# Patient Record
Sex: Female | Born: 1960 | Race: White | Hispanic: No | Smoking: Former smoker
Health system: Southern US, Community
[De-identification: ages and names within clinical notes are randomized; demographics above are authoritative.]

## PROBLEM LIST (undated history)

## (undated) DIAGNOSIS — F329 Major depressive disorder, single episode, unspecified: Secondary | ICD-10-CM

## (undated) DIAGNOSIS — F1010 Alcohol abuse, uncomplicated: Secondary | ICD-10-CM

## (undated) DIAGNOSIS — F419 Anxiety disorder, unspecified: Secondary | ICD-10-CM

## (undated) DIAGNOSIS — E039 Hypothyroidism, unspecified: Secondary | ICD-10-CM

## (undated) DIAGNOSIS — T7840XA Allergy, unspecified, initial encounter: Secondary | ICD-10-CM

## (undated) DIAGNOSIS — F32A Depression, unspecified: Secondary | ICD-10-CM

## (undated) DIAGNOSIS — B009 Herpesviral infection, unspecified: Secondary | ICD-10-CM

## (undated) DIAGNOSIS — E785 Hyperlipidemia, unspecified: Secondary | ICD-10-CM

## (undated) DIAGNOSIS — G4700 Insomnia, unspecified: Secondary | ICD-10-CM

## (undated) DIAGNOSIS — S52502A Unspecified fracture of the lower end of left radius, initial encounter for closed fracture: Secondary | ICD-10-CM

## (undated) HISTORY — PX: COLPOSCOPY: SHX161

## (undated) HISTORY — DX: Hyperlipidemia, unspecified: E78.5

## (undated) HISTORY — DX: Insomnia, unspecified: G47.00

## (undated) HISTORY — DX: Depression, unspecified: F32.A

## (undated) HISTORY — DX: Allergy, unspecified, initial encounter: T78.40XA

## (undated) HISTORY — DX: Major depressive disorder, single episode, unspecified: F32.9

## (undated) HISTORY — DX: Hypothyroidism, unspecified: E03.9

## (undated) HISTORY — DX: Herpesviral infection, unspecified: B00.9

## (undated) HISTORY — PX: COSMETIC SURGERY: SHX468

## (undated) HISTORY — DX: Alcohol abuse, uncomplicated: F10.10

## (undated) HISTORY — DX: Anxiety disorder, unspecified: F41.9

---

## 2005-09-24 ENCOUNTER — Other Ambulatory Visit: Admission: RE | Admit: 2005-09-24 | Discharge: 2005-09-24 | Payer: Self-pay | Admitting: Internal Medicine

## 2006-01-07 ENCOUNTER — Other Ambulatory Visit: Admission: RE | Admit: 2006-01-07 | Discharge: 2006-01-07 | Payer: Self-pay | Admitting: Internal Medicine

## 2007-05-18 ENCOUNTER — Ambulatory Visit (HOSPITAL_COMMUNITY): Admission: RE | Admit: 2007-05-18 | Discharge: 2007-05-18 | Payer: Self-pay | Admitting: Neurosurgery

## 2007-06-07 ENCOUNTER — Other Ambulatory Visit: Admission: RE | Admit: 2007-06-07 | Discharge: 2007-06-07 | Payer: Self-pay | Admitting: Internal Medicine

## 2007-06-08 ENCOUNTER — Ambulatory Visit (HOSPITAL_COMMUNITY): Admission: RE | Admit: 2007-06-08 | Discharge: 2007-06-08 | Payer: Self-pay | Admitting: Internal Medicine

## 2008-02-21 ENCOUNTER — Ambulatory Visit (HOSPITAL_COMMUNITY): Admission: RE | Admit: 2008-02-21 | Discharge: 2008-02-21 | Payer: Self-pay | Admitting: Internal Medicine

## 2009-09-17 ENCOUNTER — Encounter: Admission: RE | Admit: 2009-09-17 | Discharge: 2009-09-17 | Payer: Self-pay | Admitting: Internal Medicine

## 2010-01-20 ENCOUNTER — Encounter: Admission: RE | Admit: 2010-01-20 | Discharge: 2010-01-20 | Payer: Self-pay | Admitting: Neurological Surgery

## 2010-06-18 ENCOUNTER — Encounter
Admission: RE | Admit: 2010-06-18 | Discharge: 2010-06-18 | Payer: Self-pay | Source: Home / Self Care | Attending: Neurological Surgery | Admitting: Neurological Surgery

## 2010-07-05 ENCOUNTER — Emergency Department (HOSPITAL_COMMUNITY)
Admission: EM | Admit: 2010-07-05 | Discharge: 2010-07-05 | Payer: Self-pay | Source: Home / Self Care | Admitting: Emergency Medicine

## 2010-10-21 NOTE — Op Note (Signed)
Stacy [name], Stacy [name]            ACCOUNT NO.:  [id number]   MEDICAL RECORD NO.:  [id number]          PATIENT TYPE:  AMB   LOCATION:  SDS                          FACILITY:  [facility]   PHYSICIAN:  Donalee Citrin, M.D.        DATE OF BIRTH:  1961-02-21   DATE OF PROCEDURE:  05/18/2007  DATE OF DISCHARGE:                               OPERATIVE REPORT   PREOPERATIVE DIAGNOSIS:  Right carpal tunnel syndrome.   PROCEDURE:  Right carpal tunnel release.   SURGEON:  Donalee Citrin, M.D.   ANESTHESIA:  Bier block regional.   HISTORY OF PRESENT ILLNESS:  The patient is very pleasant 50 year old  female who has had progressive worsening pain the right wrist and hand.  EMG confirmed severe right carpal tunnel syndrome.  The patient failed  all forms conservative treatment and the results of the EMG and  historical findings patient is recommended right carpal tunnel release.  Risks benefits of the operation explained to patient, understand, agrees  proceed forward.   Patient brought to OR, was induced under Bier block anesthesia with  regional block.  After adequate anesthesia been confirmed the right hand  and arm was prepped and draped in the usual sterile fashion.  The  incision was made from the proximal crease of the wrist down the palmar  crease approximately 4 cm long.  The subcutaneous tissue was dissected  free to expose the transverse carpal ligament, flexor retinaculum.  This  was then divided very carefully in layers with 15 blade scalpel.  Then  once the epineurium of the median nerve was immediately visualized and a  small mosquito hemostat was used develop the plane from the undersurface  of the transverse carpal ligament from the median nerve.  This was then  divided both proximally, distally.  Hemostats were easily passed both  proximally distally to confirm complete decompression of median nerve.  Wound was copiously irrigated and hemostasis was maintained and then the  wound  incision was closed with an interrupted vertical mattress suture.  Total tourniquet time was approximately 23 minutes and the patient was  sent to recovery in stable condition.           ______________________________  Donalee Citrin, M.D.     GC/MEDQ  D:  05/18/2007  T:  05/19/2007  Job:  [id number]

## 2011-02-16 ENCOUNTER — Other Ambulatory Visit (HOSPITAL_COMMUNITY)
Admission: RE | Admit: 2011-02-16 | Discharge: 2011-02-16 | Disposition: A | Discharge status: Home / Self Care | Payer: BC Managed Care – PPO | Source: Ambulatory Visit | Attending: Internal Medicine | Admitting: Internal Medicine

## 2011-02-16 DIAGNOSIS — Z01419 Encounter for gynecological examination (general) (routine) without abnormal findings: Secondary | ICD-10-CM | POA: Insufficient documentation

## 2011-03-16 LAB — CBC
MCV: 98.6
Platelets: 232
RBC: 3.59 — ABNORMAL LOW
RDW: 13.6
WBC: 5.7

## 2011-03-16 LAB — BASIC METABOLIC PANEL
BUN: 4 — ABNORMAL LOW
GFR calc Af Amer: >60
Glucose, Bld: 80
Sodium: 129 — ABNORMAL LOW

## 2012-07-14 ENCOUNTER — Emergency Department (HOSPITAL_COMMUNITY): Payer: BC Managed Care – PPO

## 2012-07-14 ENCOUNTER — Emergency Department (HOSPITAL_COMMUNITY)
Admission: EM | Admit: 2012-07-14 | Discharge: 2012-07-14 | Disposition: A | Discharge status: Home / Self Care | Payer: BC Managed Care – PPO | Attending: Emergency Medicine | Admitting: Emergency Medicine

## 2012-07-14 DIAGNOSIS — S298XXA Other specified injuries of thorax, initial encounter: Secondary | ICD-10-CM | POA: Insufficient documentation

## 2012-07-14 DIAGNOSIS — Y92410 Unspecified street and highway as the place of occurrence of the external cause: Secondary | ICD-10-CM | POA: Insufficient documentation

## 2012-07-14 DIAGNOSIS — S0993XA Unspecified injury of face, initial encounter: Secondary | ICD-10-CM | POA: Insufficient documentation

## 2012-07-14 DIAGNOSIS — Y9389 Activity, other specified: Secondary | ICD-10-CM | POA: Insufficient documentation

## 2012-07-14 DIAGNOSIS — S46819A Strain of other muscles, fascia and tendons at shoulder and upper arm level, unspecified arm, initial encounter: Secondary | ICD-10-CM

## 2012-07-14 DIAGNOSIS — Z79899 Other long term (current) drug therapy: Secondary | ICD-10-CM | POA: Insufficient documentation

## 2012-07-14 DIAGNOSIS — S43499A Other sprain of unspecified shoulder joint, initial encounter: Secondary | ICD-10-CM | POA: Insufficient documentation

## 2012-07-14 MED ORDER — METHOCARBAMOL 500 MG PO TABS: 1000.0000 mg | ORAL_TABLET | Freq: Four times a day (QID) | ORAL | Status: DC

## 2012-07-14 MED ORDER — TRAMADOL HCL 50 MG PO TABS: 100.0000 mg | ORAL_TABLET | Freq: Four times a day (QID) | ORAL | Status: DC | PRN

## 2012-07-14 NOTE — ED Notes (Signed)
Pt requesting water.  Per RN, water not allowed until test results.  Notified pt.
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
## 2013-03-02 ENCOUNTER — Institutional Professional Consult (permissible substitution): Payer: BC Managed Care – PPO | Admitting: Internal Medicine

## 2013-04-03 ENCOUNTER — Institutional Professional Consult (permissible substitution): Payer: BC Managed Care – PPO | Admitting: Internal Medicine

## 2013-04-24 ENCOUNTER — Encounter: Payer: Self-pay | Admitting: Internal Medicine

## 2013-04-24 DIAGNOSIS — F32A Depression, unspecified: Secondary | ICD-10-CM | POA: Insufficient documentation

## 2013-04-24 DIAGNOSIS — F419 Anxiety disorder, unspecified: Secondary | ICD-10-CM | POA: Insufficient documentation

## 2013-04-24 DIAGNOSIS — I10 Essential (primary) hypertension: Secondary | ICD-10-CM

## 2013-04-24 DIAGNOSIS — G4700 Insomnia, unspecified: Secondary | ICD-10-CM | POA: Insufficient documentation

## 2013-04-24 DIAGNOSIS — F329 Major depressive disorder, single episode, unspecified: Secondary | ICD-10-CM | POA: Insufficient documentation

## 2013-04-24 DIAGNOSIS — E785 Hyperlipidemia, unspecified: Secondary | ICD-10-CM

## 2013-04-24 DIAGNOSIS — E039 Hypothyroidism, unspecified: Secondary | ICD-10-CM

## 2013-04-25 ENCOUNTER — Ambulatory Visit: Payer: BC Managed Care – PPO | Admitting: Emergency Medicine

## 2013-04-25 ENCOUNTER — Encounter: Payer: Self-pay | Admitting: Emergency Medicine

## 2013-04-25 VITALS — BP 132/80 | HR 62 | Temp 97.8°F | Resp 18 | Ht 63.0 in | Wt 112.0 lb

## 2013-04-25 DIAGNOSIS — F329 Major depressive disorder, single episode, unspecified: Secondary | ICD-10-CM

## 2013-04-25 DIAGNOSIS — R5381 Other malaise: Secondary | ICD-10-CM

## 2013-04-25 DIAGNOSIS — E782 Mixed hyperlipidemia: Secondary | ICD-10-CM

## 2013-04-25 DIAGNOSIS — E559 Vitamin D deficiency, unspecified: Secondary | ICD-10-CM

## 2013-04-25 DIAGNOSIS — I10 Essential (primary) hypertension: Secondary | ICD-10-CM

## 2013-04-25 DIAGNOSIS — E039 Hypothyroidism, unspecified: Secondary | ICD-10-CM

## 2013-04-25 DIAGNOSIS — J329 Chronic sinusitis, unspecified: Secondary | ICD-10-CM

## 2013-04-25 LAB — CBC WITH DIFFERENTIAL/PLATELET
Basophils Absolute: 0 10*3/uL (ref 0.0–0.1)
Basophils Relative: 0 % (ref 0–1)
Lymphocytes Relative: 22 % (ref 12–46)
Lymphs Abs: 1.8 10*3/uL (ref 0.7–4.0)
MCH: 36.1 pg — ABNORMAL HIGH (ref 26.0–34.0)
MCHC: 35.8 g/dL (ref 30.0–36.0)
MCV: 100.8 fL — ABNORMAL HIGH (ref 78.0–100.0)
Neutrophils Relative %: 69 % (ref 43–77)
Platelets: 242 10*3/uL (ref 150–400)
RDW: 13.8 % (ref 11.5–15.5)
WBC: 8.3 10*3/uL (ref 4.0–10.5)

## 2013-04-25 LAB — HEPATIC FUNCTION PANEL
ALT: 33 U/L (ref 0–35)
Albumin: 5 g/dL (ref 3.5–5.2)
Alkaline Phosphatase: 57 U/L (ref 39–117)
Bilirubin, Direct: 0.1 mg/dL (ref 0.0–0.3)
Total Bilirubin: 0.5 mg/dL (ref 0.3–1.2)
Total Protein: 7.6 g/dL (ref 6.0–8.3)

## 2013-04-25 LAB — LIPID PANEL
Cholesterol: 210 mg/dL — ABNORMAL HIGH (ref 0–200)
LDL Cholesterol: 92 mg/dL (ref 0–99)
Triglycerides: 109 mg/dL (ref <150)

## 2013-04-25 LAB — BASIC METABOLIC PANEL WITH GFR
Creat: 0.59 mg/dL (ref 0.50–1.10)
GFR, Est African American: >89 mL/min
Potassium: 4.5 mEq/L (ref 3.5–5.3)

## 2013-04-25 MED ORDER — AZITHROMYCIN 250 MG PO TABS: ORAL_TABLET | ORAL | Status: AC

## 2013-04-25 MED ORDER — ALPRAZOLAM 0.5 MG PO TABS: 0.5000 mg | ORAL_TABLET | Freq: Three times a day (TID) | ORAL | Status: DC | PRN

## 2013-04-25 NOTE — Progress Notes (Signed)
Subjective:    Patient ID: Stacy [name], female    DOB: 02-12-1961, 52 y.o.   MRN: [id number]  HPI Comments: 52 yo female presents for recheck of cholesterol, TSH, Depression. She is still under stress with family and feels it is contributing to elevated BP. She has not been exercising or eating healthy. She has decreased wine intake. She is still not sleeping well with the stress and travel schedule and still feels a little depressed even with increasing Brintellix dose to 20 mg. She changed Thyroid dose x 1 month then went back to previous dose, since she did not notice any change with energy or mood. She has noticed increased sinus congestion and pressure over last 2 weeks now with production increase    Current Outpatient Prescriptions on File Prior to Visit  Medication Sig Dispense Refill  . acyclovir (ZOVIRAX) 200 MG capsule Take 200 mg by mouth 2 (two) times daily.      . eszopiclone (LUNESTA) 2 MG TABS tablet Take 2 mg by mouth at bedtime as needed for sleep. Take immediately before bedtime      . fluticasone (FLONASE) 50 MCG/ACT nasal spray Place 2 sprays into the nose daily.      Marland Kitchen levocetirizine (XYZAL) 5 MG tablet Take 5 mg by mouth every evening.      Marland Kitchen levothyroxine (SYNTHROID, LEVOTHROID) 88 MCG tablet Take 88 mcg by mouth daily.      . nadolol (CORGARD) 20 MG tablet Take 20 mg by mouth 2 (two) times daily.      . Vortioxetine HBr (BRINTELLIX) 20 MG TABS Take 20 mg by mouth daily. 1/2 occasional      . methocarbamol (ROBAXIN) 500 MG tablet Take 2 tablets (1,000 mg total) by mouth 4 (four) times daily.  80 tablet  0  . traMADol (ULTRAM) 50 MG tablet Take 2 tablets (100 mg total) by mouth every 6 (six) hours as needed for pain.  16 tablet  0   No current facility-administered medications on file prior to visit.   ALLERGIES Ambien  Past Medical History  Diagnosis Date  . Hypertension   . Anxiety   . Depression   . Unspecified hypothyroidism   . Insomnia   . HSV (herpes  simplex virus) infection   . Allergy   . Hyperlipidemia      Review of Systems  Constitutional: Positive for fatigue.  HENT: Positive for congestion and sinus pressure.   Psychiatric/Behavioral: Positive for sleep disturbance. The patient is nervous/anxious.   All other systems reviewed and are negative.    BP 132/80  Pulse 62  Temp(Src) 97.8 F (36.6 C) (Temporal)  Resp 18  Ht 5\' 3"  (1.6 m)  Wt 112 lb (50.803 kg)  BMI 19.84 kg/m2     Objective:   Physical Exam  Nursing note and vitals reviewed. Constitutional: She is oriented to person, place, and time. She appears well-developed and well-nourished.  HENT:  Head: Normocephalic and atraumatic.  Eyes: Pupils are equal, round, and reactive to light.  Neck: Normal range of motion. No thyromegaly present.  Cardiovascular: Normal rate, regular rhythm, normal heart sounds and intact distal pulses.   Pulmonary/Chest: Effort normal and breath sounds normal.  Abdominal: Soft.  Musculoskeletal: Normal range of motion.  Lymphadenopathy:    She has no cervical adenopathy.  Neurological: She is alert and oriented to person, place, and time.  Skin: Skin is warm and dry.  Psychiatric: She has a normal mood and affect. Judgment normal.  Assessment & Plan:  1. Cholesterol, Hypothyroid- recheck labs 2. Depression, sleep disturbance, fatigue- needs routine set and increase counseling/ exercise, decrease ETOH 3. Sinusitis- Zpak AD, mucinex AD.

## 2013-04-25 NOTE — Patient Instructions (Signed)
Fat and Cholesterol Control Diet Fat and cholesterol levels in your blood and organs are influenced by your diet. High levels of fat and cholesterol may lead to diseases of the heart, small and large blood vessels, gallbladder, liver, and pancreas. CONTROLLING FAT AND CHOLESTEROL WITH DIET Although exercise and lifestyle factors are important, your diet is key. That is because certain foods are known to raise cholesterol and others to lower it. The goal is to balance foods for their effect on cholesterol and more importantly, to replace saturated and trans fat with other types of fat, such as monounsaturated fat, polyunsaturated fat, and omega-3 fatty acids. On average, a person should consume no more than 15 to 17 g of saturated fat daily. Saturated and trans fats are considered "bad" fats, and they will raise LDL cholesterol. Saturated fats are primarily found in animal products such as meats, butter, and cream. However, that does not mean you need to give up all your favorite foods. Today, there are good tasting, low-fat, low-cholesterol substitutes for most of the things you like to eat. Choose low-fat or nonfat alternatives. Choose round or loin cuts of red meat. These types of cuts are lowest in fat and cholesterol. Chicken (without the skin), fish, veal, and ground turkey breast are great choices. Eliminate fatty meats, such as hot dogs and salami. Even shellfish have little or no saturated fat. Have a 3 oz (85 g) portion when you eat lean meat, poultry, or fish. Trans fats are also called "partially hydrogenated oils." They are oils that have been scientifically manipulated so that they are solid at room temperature resulting in a longer shelf life and improved taste and texture of foods in which they are added. Trans fats are found in stick margarine, some tub margarines, cookies, crackers, and baked goods.  When baking and cooking, oils are a great substitute for butter. The monounsaturated oils are  especially beneficial since it is believed they lower LDL and raise HDL. The oils you should avoid entirely are saturated tropical oils, such as coconut and palm.  Remember to eat a lot from food groups that are naturally free of saturated and trans fat, including fish, fruit, vegetables, beans, grains (barley, rice, couscous, bulgur wheat), and pasta (without cream sauces).  IDENTIFYING FOODS THAT LOWER FAT AND CHOLESTEROL  Soluble fiber may lower your cholesterol. This type of fiber is found in fruits such as apples, vegetables such as broccoli, potatoes, and carrots, legumes such as beans, peas, and lentils, and grains such as barley. Foods fortified with plant sterols (phytosterol) may also lower cholesterol. You should eat at least 2 g per day of these foods for a cholesterol lowering effect.  Read package labels to identify low-saturated fats, trans fat free, and low-fat foods at the supermarket. Select cheeses that have only 2 to 3 g saturated fat per ounce. Use a heart-healthy tub margarine that is free of trans fats or partially hydrogenated oil. When buying baked goods (cookies, crackers), avoid partially hydrogenated oils. Breads and muffins should be made from whole grains (whole-wheat or whole oat flour, instead of "flour" or "enriched flour"). Buy non-creamy canned soups with reduced salt and no added fats.  FOOD PREPARATION TECHNIQUES  Never deep-fry. If you must fry, either stir-fry, which uses very little fat, or use non-stick cooking sprays. When possible, broil, bake, or roast meats, and steam vegetables. Instead of putting butter or margarine on vegetables, use lemon and herbs, applesauce, and cinnamon (for squash and sweet potatoes). Use nonfat   yogurt, salsa, and low-fat dressings for salads.  LOW-SATURATED FAT / LOW-FAT FOOD SUBSTITUTES Meats / Saturated Fat (g)  Avoid: Steak, marbled (3 oz/85 g) / 11 g  Choose: Steak, lean (3 oz/85 g) / 4 g  Avoid: Hamburger (3 oz/85 g) / 7  g  Choose: Hamburger, lean (3 oz/85 g) / 5 g  Avoid: Ham (3 oz/85 g) / 6 g  Choose: Ham, lean cut (3 oz/85 g) / 2.4 g  Avoid: Chicken, with skin, dark meat (3 oz/85 g) / 4 g  Choose: Chicken, skin removed, dark meat (3 oz/85 g) / 2 g  Avoid: Chicken, with skin, light meat (3 oz/85 g) / 2.5 g  Choose: Chicken, skin removed, light meat (3 oz/85 g) / 1 g Dairy / Saturated Fat (g)  Avoid: Whole milk (1 cup) / 5 g  Choose: Low-fat milk, 2% (1 cup) / 3 g  Choose: Low-fat milk, 1% (1 cup) / 1.5 g  Choose: Skim milk (1 cup) / 0.3 g  Avoid: Hard cheese (1 oz/28 g) / 6 g  Choose: Skim milk cheese (1 oz/28 g) / 2 to 3 g  Avoid: Cottage cheese, 4% fat (1 cup) / 6.5 g  Choose: Low-fat cottage cheese, 1% fat (1 cup) / 1.5 g  Avoid: Ice cream (1 cup) / 9 g  Choose: Sherbet (1 cup) / 2.5 g  Choose: Nonfat frozen yogurt (1 cup) / 0.3 g  Choose: Frozen fruit bar / trace  Avoid: Whipped cream (1 tbs) / 3.5 g  Choose: Nondairy whipped topping (1 tbs) / 1 g Condiments / Saturated Fat (g)  Avoid: Mayonnaise (1 tbs) / 2 g  Choose: Low-fat mayonnaise (1 tbs) / 1 g  Avoid: Butter (1 tbs) / 7 g  Choose: Extra light margarine (1 tbs) / 1 g  Avoid: Coconut oil (1 tbs) / 11.8 g  Choose: Olive oil (1 tbs) / 1.8 g  Choose: Corn oil (1 tbs) / 1.7 g  Choose: Safflower oil (1 tbs) / 1.2 g  Choose: Sunflower oil (1 tbs) / 1.4 g  Choose: Soybean oil (1 tbs) / 2.4 g  Choose: Canola oil (1 tbs) / 1 g Document Released: 05/25/2005 Document Revised: 09/19/2012 Document Reviewed: 11/13/2010 [employer] Patient Information 2014 [employer]. Sinusitis Sinusitis is redness, soreness, and puffiness (inflammation) of the air pockets in the bones of your face (sinuses). The redness, soreness, and puffiness can cause air and mucus to get trapped in your sinuses. This can allow germs to grow and cause an infection.  HOME CARE   Drink enough fluids to keep your pee (urine) clear or pale  yellow.  Use a humidifier in your home.  Run a hot shower to create steam in the bathroom. Sit in the bathroom with the door closed. Breathe in the steam 3 4 times a day.  Put a warm, moist washcloth on your face 3 4 times a day, or as told by your doctor.  Use salt water sprays (saline sprays) to wet the thick fluid in your nose. This can help the sinuses drain.  Only take medicine as told by your doctor. GET HELP RIGHT AWAY IF:   Your pain gets worse.  You have very bad headaches.  You are sick to your stomach (nauseous).  You throw up (vomit).  You are very sleepy (drowsy) all the time.  Your face is puffy (swollen).  Your vision changes.  You have a stiff neck.  You have trouble breathing. MAKE   SURE YOU:   Understand these instructions.  Will watch your condition.  Will get help right away if you are not doing well or get worse. Document Released: 11/11/2007 Document Revised: 02/17/2012 Document Reviewed: 12/29/2011 [employer] Patient Information 2014 [employer].  

## 2013-04-26 LAB — FOLATE RBC: RBC Folate: 872 ng/mL (ref >280)

## 2013-04-27 ENCOUNTER — Telehealth: Payer: Self-pay | Admitting: *Deleted

## 2013-04-27 NOTE — Telephone Encounter (Signed)
Message copied by Nicholaus Corolla A on Thu Apr 27, 2013 11:05 AM ------      Message from: [place], [place] R      Created: Thu Apr 27, 2013  8:44 AM       Add 250 mg Magnesium. TSH low end normal try to take 1/2 on MWF, 1 rest of week may help with mood and energy, keep dosing this way do not revert back to old dose. Cholesterol and liver mild elevation need to decrease all forms of ETOH and eat healthier. Rest ok continue same.  Needs OV 3 months ------

## 2013-04-27 NOTE — Telephone Encounter (Signed)
Spoke with patient about lab results and instructions. 

## 2013-05-08 ENCOUNTER — Other Ambulatory Visit: Payer: Self-pay | Admitting: Emergency Medicine

## 2013-05-08 MED ORDER — ESZOPICLONE 2 MG PO TABS: 2.0000 mg | ORAL_TABLET | Freq: Every evening | ORAL | Status: DC | PRN

## 2013-05-26 ENCOUNTER — Other Ambulatory Visit: Payer: Self-pay | Admitting: Emergency Medicine

## 2013-05-26 DIAGNOSIS — F329 Major depressive disorder, single episode, unspecified: Secondary | ICD-10-CM

## 2013-05-26 DIAGNOSIS — F32A Depression, unspecified: Secondary | ICD-10-CM

## 2013-05-26 MED ORDER — ALPRAZOLAM 0.5 MG PO TABS: 0.5000 mg | ORAL_TABLET | Freq: Three times a day (TID) | ORAL | Status: DC | PRN

## 2013-06-05 ENCOUNTER — Other Ambulatory Visit: Payer: Self-pay | Admitting: Emergency Medicine

## 2013-06-05 MED ORDER — ESZOPICLONE 2 MG PO TABS: 2.0000 mg | ORAL_TABLET | Freq: Every evening | ORAL | Status: DC | PRN

## 2013-07-05 ENCOUNTER — Other Ambulatory Visit: Payer: Self-pay | Admitting: Emergency Medicine

## 2013-07-05 MED ORDER — ESZOPICLONE 2 MG PO TABS: 2.0000 mg | ORAL_TABLET | Freq: Every evening | ORAL | Status: DC | PRN

## 2013-07-06 ENCOUNTER — Other Ambulatory Visit: Payer: Self-pay | Admitting: Physician Assistant

## 2013-07-12 ENCOUNTER — Other Ambulatory Visit: Payer: Self-pay | Admitting: Internal Medicine

## 2013-07-20 ENCOUNTER — Ambulatory Visit: Payer: Self-pay | Admitting: Emergency Medicine

## 2013-07-26 ENCOUNTER — Ambulatory Visit: Payer: Self-pay | Admitting: Emergency Medicine

## 2013-08-01 ENCOUNTER — Ambulatory Visit: Payer: Self-pay | Admitting: Emergency Medicine

## 2013-08-08 ENCOUNTER — Ambulatory Visit: Payer: Self-pay | Admitting: Emergency Medicine

## 2013-08-09 ENCOUNTER — Ambulatory Visit: Payer: Self-pay | Admitting: Emergency Medicine

## 2013-08-13 ENCOUNTER — Other Ambulatory Visit: Payer: Self-pay | Admitting: Internal Medicine

## 2013-08-13 MED ORDER — ALPRAZOLAM 0.5 MG PO TABS: 0.5000 mg | ORAL_TABLET | Freq: Three times a day (TID) | ORAL | Status: DC | PRN

## 2013-08-14 ENCOUNTER — Ambulatory Visit (INDEPENDENT_AMBULATORY_CARE_PROVIDER_SITE_OTHER): Payer: BC Managed Care – PPO | Admitting: Emergency Medicine

## 2013-08-14 ENCOUNTER — Encounter: Payer: Self-pay | Admitting: Emergency Medicine

## 2013-08-14 VITALS — BP 138/90 | HR 68 | Temp 98.6°F | Resp 18 | Ht 63.0 in | Wt 112.0 lb

## 2013-08-14 DIAGNOSIS — R5381 Other malaise: Secondary | ICD-10-CM

## 2013-08-14 DIAGNOSIS — F329 Major depressive disorder, single episode, unspecified: Secondary | ICD-10-CM

## 2013-08-14 DIAGNOSIS — E039 Hypothyroidism, unspecified: Secondary | ICD-10-CM

## 2013-08-14 DIAGNOSIS — F32A Depression, unspecified: Secondary | ICD-10-CM

## 2013-08-14 DIAGNOSIS — E538 Deficiency of other specified B group vitamins: Secondary | ICD-10-CM

## 2013-08-14 DIAGNOSIS — Z79899 Other long term (current) drug therapy: Secondary | ICD-10-CM

## 2013-08-14 DIAGNOSIS — F3289 Other specified depressive episodes: Secondary | ICD-10-CM

## 2013-08-14 DIAGNOSIS — R5383 Other fatigue: Principal | ICD-10-CM

## 2013-08-14 DIAGNOSIS — F411 Generalized anxiety disorder: Secondary | ICD-10-CM

## 2013-08-14 DIAGNOSIS — D649 Anemia, unspecified: Secondary | ICD-10-CM

## 2013-08-14 DIAGNOSIS — E559 Vitamin D deficiency, unspecified: Secondary | ICD-10-CM

## 2013-08-14 DIAGNOSIS — F419 Anxiety disorder, unspecified: Secondary | ICD-10-CM

## 2013-08-14 DIAGNOSIS — E782 Mixed hyperlipidemia: Secondary | ICD-10-CM

## 2013-08-14 DIAGNOSIS — I10 Essential (primary) hypertension: Secondary | ICD-10-CM

## 2013-08-14 LAB — BASIC METABOLIC PANEL WITH GFR
BUN: 12 mg/dL (ref 6–23)
CALCIUM: 10.4 mg/dL (ref 8.4–10.5)
CO2: 27 meq/L (ref 19–32)
CREATININE: 0.65 mg/dL (ref 0.50–1.10)
Chloride: 98 mEq/L (ref 96–112)
GFR, Est African American: >89 mL/min
GFR, Est Non African American: >89 mL/min
GLUCOSE: 108 mg/dL — AB (ref 70–99)
Potassium: 4.3 mEq/L (ref 3.5–5.3)
SODIUM: 137 meq/L (ref 135–145)

## 2013-08-14 LAB — CBC WITH DIFFERENTIAL/PLATELET
Basophils Absolute: 0 10*3/uL (ref 0.0–0.1)
Basophils Relative: 1 % (ref 0–1)
EOS ABS: 0 10*3/uL (ref 0.0–0.7)
EOS PCT: 1 % (ref 0–5)
HCT: 37.2 % (ref 36.0–46.0)
Hemoglobin: 12.8 g/dL (ref 12.0–15.0)
LYMPHS ABS: 1.9 10*3/uL (ref 0.7–4.0)
Lymphocytes Relative: 40 % (ref 12–46)
MCH: 34.9 pg — AB (ref 26.0–34.0)
MCHC: 34.4 g/dL (ref 30.0–36.0)
MCV: 101.4 fL — AB (ref 78.0–100.0)
Monocytes Absolute: 0.5 10*3/uL (ref 0.1–1.0)
Monocytes Relative: 11 % (ref 3–12)
Neutro Abs: 2.3 10*3/uL (ref 1.7–7.7)
Neutrophils Relative %: 47 % (ref 43–77)
PLATELETS: 241 10*3/uL (ref 150–400)
RBC: 3.67 MIL/uL — AB (ref 3.87–5.11)
RDW: 13.7 % (ref 11.5–15.5)
WBC: 4.8 10*3/uL (ref 4.0–10.5)

## 2013-08-14 LAB — HEPATIC FUNCTION PANEL
ALBUMIN: 5.1 g/dL (ref 3.5–5.2)
ALT: 41 U/L — ABNORMAL HIGH (ref 0–35)
AST: 77 U/L — ABNORMAL HIGH (ref 0–37)
Alkaline Phosphatase: 70 U/L (ref 39–117)
Bilirubin, Direct: 0.2 mg/dL (ref 0.0–0.3)
Indirect Bilirubin: 0.8 mg/dL (ref 0.2–1.2)
Total Bilirubin: 1 mg/dL (ref 0.2–1.2)
Total Protein: 7.7 g/dL (ref 6.0–8.3)

## 2013-08-14 LAB — LIPID PANEL
Cholesterol: 241 mg/dL — ABNORMAL HIGH (ref 0–200)
HDL: 107 mg/dL (ref >39)
LDL Cholesterol: 122 mg/dL — ABNORMAL HIGH (ref 0–99)
TRIGLYCERIDES: 61 mg/dL (ref <150)
Total CHOL/HDL Ratio: 2.3 Ratio
VLDL: 12 mg/dL (ref 0–40)

## 2013-08-14 LAB — IRON AND TIBC
%SAT: 73 % — AB (ref 20–55)
IRON: 270 ug/dL — AB (ref 42–145)
TIBC: 369 ug/dL (ref 250–470)
UIBC: 99 ug/dL — ABNORMAL LOW (ref 125–400)

## 2013-08-14 LAB — TSH: TSH: 4.071 u[IU]/mL (ref 0.350–4.500)

## 2013-08-14 LAB — VITAMIN B12: VITAMIN B 12: 389 pg/mL (ref 211–911)

## 2013-08-14 LAB — MAGNESIUM: Magnesium: 1.6 mg/dL (ref 1.5–2.5)

## 2013-08-14 MED ORDER — SERTRALINE HCL 50 MG PO TABS: 50.0000 mg | ORAL_TABLET | Freq: Every day | ORAL | Status: DC

## 2013-08-14 NOTE — Progress Notes (Signed)
Subjective:    Patient ID: Stacy [name], female    DOB: 04/21/1961, 53 y.o.   MRN: [id number]  HPI Comments: 53 yo pleasant but emotional female. She has a long history of anxiety and depression and has been out of her Brintellix for at least 9 days. She noted it did not fully relieve anxiety/ depression even before she ran out, but symptoms are significantly worse. She does not want to leave the house nor be around people. She is taking more Lunesta than prescribed and now has added xanax because she is not sleeping. She feels scattered confused and notes concern for learning disability vs ADD vs anxiety. She notes + stress with family mild increase but no other reasons for increase in overall symptoms. She has increased ETOH intake and decreased activity due to her fatigue and mood. She has missed or had to reschedule several appointments with her increased confusion/ anxiety. SHE HAS FAILED WELLBUTRIN/ LEXAPRO/ CELEXA  She is overdue for labs for cholesterol but knows they will be bad with poor diet, ETOH, and lack of exercise. T225 L 108 H 102 B12 315 A1C 5.3 D 63  ALT           33   04/25/2013 AST           48   04/25/2013 ALKPHOS       57   04/25/2013 BILITOT      0.5   04/25/2013 WBC      8.3   04/25/2013 HGB     13.7   04/25/2013 HCT     38.3   04/25/2013 MCV    100.8   04/25/2013 PLT      242   04/25/2013 TSH    0.844   04/25/2013   Anxiety Symptoms include confusion, decreased concentration and nervous/anxious behavior. Patient reports no suicidal ideas.       Current Outpatient Prescriptions on File Prior to Visit  Medication Sig Dispense Refill  . acyclovir (ZOVIRAX) 200 MG capsule Take 200 mg by mouth 2 (two) times daily as needed.       . ALPRAZolam (XANAX) 0.5 MG tablet Take 1 tablet (0.5 mg total) by mouth 3 (three) times daily as needed for anxiety.  90 tablet  0  . eszopiclone (LUNESTA) 2 MG TABS tablet Take 1 tablet (2 mg total) by mouth at bedtime as needed for  sleep. Take immediately before bedtime  30 tablet  1  . fluticasone (FLONASE) 50 MCG/ACT nasal spray Place 2 sprays into the nose daily.      Marland Kitchen. levocetirizine (XYZAL) 5 MG tablet Take 5 mg by mouth daily as needed.       Marland Kitchen. levothyroxine (SYNTHROID, LEVOTHROID) 88 MCG tablet Take 88 mcg by mouth daily.      . nadolol (CORGARD) 20 MG tablet Take 20 mg by mouth 2 (two) times daily.      . Vortioxetine HBr (BRINTELLIX) 20 MG TABS Take 20 mg by mouth daily. 1/2 occasional       No current facility-administered medications on file prior to visit.   Allergies  Allergen Reactions  . Ambien [Zolpidem Tartrate] Other (See Comments)    hallucinations   Past Medical History  Diagnosis Date  . Hypertension   . Anxiety   . Depression   . Unspecified hypothyroidism   . Insomnia   . HSV (herpes simplex virus) infection   . Allergy   . Hyperlipidemia      Review of Systems  Constitutional: Positive for fatigue.  Psychiatric/Behavioral: Positive for confusion, sleep disturbance and decreased concentration. Negative for suicidal ideas. The patient is nervous/anxious.   All other systems reviewed and are negative.   BP 138/90  Pulse 68  Temp(Src) 98.6 F (37 C) (Temporal)  Resp 18  Ht 5\' 3"  (1.6 m)  Wt 112 lb (50.803 kg)  BMI 19.84 kg/m2     Objective:   Physical Exam  Nursing note and vitals reviewed. Constitutional: She is oriented to person, place, and time. She appears well-developed and well-nourished. No distress.  HENT:  Head: Normocephalic and atraumatic.  Right Ear: External ear normal.  Left Ear: External ear normal.  Nose: Nose normal.  Mouth/Throat: Oropharynx is clear and moist.  Eyes: Conjunctivae and EOM are normal.  Neck: Normal range of motion. Neck supple. No JVD present. No thyromegaly present.  Cardiovascular: Normal rate, regular rhythm, normal heart sounds and intact distal pulses.   Pulmonary/Chest: Effort normal and breath sounds normal.  Abdominal: Soft.  Bowel sounds are normal. She exhibits no distension and no mass. There is no tenderness. There is no rebound and no guarding.  Musculoskeletal: Normal range of motion. She exhibits no edema and no tenderness.  Lymphadenopathy:    She has no cervical adenopathy.  Neurological: She is alert and oriented to person, place, and time. No cranial nerve deficit.  Skin: Skin is warm and dry. No rash noted. No erythema. No pallor.  Psychiatric: Her behavior is normal. Judgment normal.  TEARFUL, ANXIOUS, SCATTERED. Multiple lists/calendars brought by patient with multiple tasks on each day, with corrections and directions scribbled everywhere. Hard to keep her focused during discussion.          Assessment & Plan:  1. DEPRESSION/ Mood disorder/ Anxiety/ Insomnia/ Etoh Dependence/ Confusion- LONG Discussion about compliance with medications, NO ETOH with medications, need for counseling and Psychiatric evaluation. DO NOT restart Brintellix will try Zoloft 50 mg 1 daily and consider Mood stabilizer. HARD to try to manage medications when patient is not taking them AD. She declines CT head to evaluate confusion, advised could be from improperly taking RX or lack of sleep or combo with ETOH. Advised needs serious re-evaluation of life and stress before things get worse. Advised we will send for LD testing vs ADD once we get mood stable. w/c if SX increase or ER.  2. Hypothyroid vs Fatigue- check labs, increase activity and H2O, concern for Hx of improperly taking Synthroid, recheck labs vs fatigue with anemia hx vs depression. She wants/ will get referral to Endocrine.  3. Cholesterol- recheck labs, Need to eat healthier and exercise AD.  OVER 40 minutes of exam, counseling, chart review, referral performed

## 2013-08-14 NOTE — Patient Instructions (Signed)
Depression, Adult Depression is feeling sad, low, down in the dumps, blue, gloomy, or empty. In general, there are two kinds of depression:  Normal sadness or grief. This can happen after something upsetting. It often goes away on its own within 2 weeks. After losing a loved one (bereavement), normal sadness and grief may last longer than two weeks. It usually gets better with time. Clinical depression. This kind lasts longer than normal sadness or grief. It keeps you from doing the things you normally do in life. It is often hard to function Social Anxiety Disorder Social anxiety disorder, previously called social phobia, is a mental disorder. People with social anxiety disorder frequently feel nervous, afraid, or embarrassed when around other people in social situations. They constantly worry that other people are judging or criticizing them for how they look, what they say, or how they act. They may worry that other people might reject them because of their appearance or behavior. Social anxiety disorder is more than just occasional shyness or self-consciousness. It can cause severe emotional distress. It can interfere with daily life activities. Social anxiety disorder also may lead to excessive alcohol or drug use and even suicide.  Social anxiety disorder is actually one of the most common mental disorders. It can develop at any time but usually starts in the teenage years. Women are more commonly affected than men. Social anxiety disorder is also more common in people who have family members with anxiety disorders. It also is more common in people who have physical deformities or conditions with characteristics that are obvious to others, such as stuttered speech or movement abnormalities (Parkinson disease).  SYMPTOMS  In addition to feeling anxious or fearful in social situations, people with social anxiety disorder frequently have physical symptoms. Examples include: Red face (blushing). Racing  heart. Sweating. Shaky hands or voice. Confusion. Lightheadedness. Upset stomach and diarrhea. DIAGNOSIS  Social anxiety disorder is diagnosed through an assessment by your caregiver. Your caregiver will ask you questions about your mood, thoughts, and reactions in social situations. Your caregiver may ask you about your medical history and use of alcohol or drugs, including prescription medications. Certain medical conditions and the use of certain substances, including caffeine, can cause symptoms similar to social anxiety disorder. Your caregiver may refer you to a mental health specialist for further evaluation or treatment. The criteria for diagnosis of social anxiety disorder are: Marked fear or anxiety in one or more social situations in which you may be closely watched or studied by others. Examples of such situations include: Interacting socially (having a conversation with others, going to a party, or meeting strangers). Being observed (eating or drinking in public or being called on in class). Performing in front of others (giving a speech). The social situations of concern almost always cause fear or anxiety, not just occasionally. People with social anxiety disorder fear that they will be viewed negatively in a way that will be embarrassing, will lead to rejection, or will offend others. This fear is out of proportion to the actual threat posed by the social situation. Often the triggering social situations are avoided, or they are endured with intense fear or anxiety. The fear, anxiety, or avoidance is persistent and lasts for 6 months or longer. The anxiety causes difficulty functioning in at least some parts of your daily life. TREATMENT  Several types of treatment are available for social anxiety disorder. These treatments are often used in combination and include:  Talk therapy. Group talk therapy allows  you to see that you are not alone with these problems. Individual talk  therapy helps you address your specific anxiety issues with a caring professional. The most effective forms of talk therapy for social anxiety disorder are cognitive behavioral therapy and exposure therapy. Cognitive behavioral therapy helps you to identify and change negative thoughts and beliefs that are at the root of the disorder. Exposure therapy allows you to gradually face the situations that you fear most. Relaxation and coping techniques. These include deep breathing, self-talk, meditation, visual imagery, and yoga. Relaxation techniques help to keep you calm in social situations. Social Optician, dispensingskills training.Social skills can be learned on your own or with the help of a talk therapist. They can help you feel more confident and comfortable in social situations. Medication. For anxiety limited to performance situations (performance anxiety), medication called beta blockers can help by reducing or preventing the physical symptoms of social anxiety disorder. For more persistent and generalized social anxiety, antidepressant medication may be prescribed to help control symptoms. In severe cases of social anxiety disorder, strong antianxiety medication, called benzodiazepines, may be prescribed on a limited basis and for a short time. Document Released: 04/23/2005 Document Revised: 09/19/2012 Document Reviewed: 08/23/2012 [facility] Patient Information 2014 [place], [place].  at home, work, or at school. It may affect your relationships with others. Treatment is often needed. GET HELP RIGHT AWAY IF:  You have thoughts about hurting yourself or others.  You lose touch with reality (psychotic symptoms). You may:  See or hear things that are not real.  Have untrue beliefs about your life or people around you.  Your medicine is giving you problems. MAKE SURE YOU:  Understand these instructions.  Will watch your condition.  Will get help right away if you are not doing well or get worse. Document  Released: 06/27/2010 Document Revised: 02/17/2012 Document Reviewed: 09/24/2011 [facility] Patient Information 2014 [place]Care, [place].

## 2013-08-15 LAB — VITAMIN D 25 HYDROXY (VIT D DEFICIENCY, FRACTURES): Vit D, 25-Hydroxy: 45 ng/mL (ref 30–89)

## 2013-08-23 ENCOUNTER — Telehealth: Payer: Self-pay | Admitting: *Deleted

## 2013-08-23 NOTE — Telephone Encounter (Signed)
RT CALL ( LEAVE DETAIL MESSAGE ON VM)

## 2013-08-25 ENCOUNTER — Ambulatory Visit: Payer: Self-pay | Admitting: Emergency Medicine

## 2013-08-28 ENCOUNTER — Ambulatory Visit: Payer: BC Managed Care – PPO | Admitting: Endocrinology

## 2013-08-30 ENCOUNTER — Telehealth: Payer: Self-pay | Admitting: Internal Medicine

## 2013-08-30 ENCOUNTER — Other Ambulatory Visit: Payer: Self-pay | Admitting: Emergency Medicine

## 2013-08-30 MED ORDER — ESZOPICLONE 2 MG PO TABS: 2.0000 mg | ORAL_TABLET | Freq: Every evening | ORAL | Status: DC | PRN

## 2013-08-30 NOTE — Telephone Encounter (Signed)
PT REQUESTING LUNESTA RENEWAL, FLYING OUT 27TH, AND RENEWAL NOT AVAILABLE UNTIL 27TH, CAN SHE GET IT EARLIER?  PHARM- [facility] [address] -[phone]  PS[facility], WILL HAVE SCHEDULE AND CALL TOMORROW.  THANKS, KAT

## 2013-08-31 ENCOUNTER — Other Ambulatory Visit: Payer: Self-pay | Admitting: Emergency Medicine

## 2013-09-05 ENCOUNTER — Encounter: Payer: Self-pay | Admitting: Emergency Medicine

## 2013-09-05 ENCOUNTER — Ambulatory Visit (INDEPENDENT_AMBULATORY_CARE_PROVIDER_SITE_OTHER): Payer: BC Managed Care – PPO | Admitting: Emergency Medicine

## 2013-09-05 VITALS — BP 124/70 | HR 68 | Temp 98.2°F | Resp 16 | Ht 63.0 in | Wt 112.0 lb

## 2013-09-05 DIAGNOSIS — E039 Hypothyroidism, unspecified: Secondary | ICD-10-CM

## 2013-09-05 DIAGNOSIS — R5381 Other malaise: Secondary | ICD-10-CM

## 2013-09-05 DIAGNOSIS — R413 Other amnesia: Secondary | ICD-10-CM

## 2013-09-05 DIAGNOSIS — F411 Generalized anxiety disorder: Secondary | ICD-10-CM

## 2013-09-05 DIAGNOSIS — R5383 Other fatigue: Secondary | ICD-10-CM

## 2013-09-05 LAB — BASIC METABOLIC PANEL WITH GFR
BUN: 4 mg/dL — AB (ref 6–23)
CO2: 26 mEq/L (ref 19–32)
Calcium: 10.2 mg/dL (ref 8.4–10.5)
Chloride: 93 mEq/L — ABNORMAL LOW (ref 96–112)
Creat: 0.55 mg/dL (ref 0.50–1.10)
GFR, Est African American: >89 mL/min
Glucose, Bld: 77 mg/dL (ref 70–99)
POTASSIUM: 4.4 meq/L (ref 3.5–5.3)
SODIUM: 130 meq/L — AB (ref 135–145)

## 2013-09-05 LAB — CBC WITH DIFFERENTIAL/PLATELET
BASOS PCT: 1 % (ref 0–1)
Basophils Absolute: 0 10*3/uL (ref 0.0–0.1)
Eosinophils Absolute: 0 10*3/uL (ref 0.0–0.7)
Eosinophils Relative: 1 % (ref 0–5)
HCT: 36.6 % (ref 36.0–46.0)
Hemoglobin: 13 g/dL (ref 12.0–15.0)
Lymphocytes Relative: 31 % (ref 12–46)
Lymphs Abs: 1.4 10*3/uL (ref 0.7–4.0)
MCH: 35.4 pg — ABNORMAL HIGH (ref 26.0–34.0)
MCHC: 35.5 g/dL (ref 30.0–36.0)
MCV: 99.7 fL (ref 78.0–100.0)
MONOS PCT: 10 % (ref 3–12)
Monocytes Absolute: 0.5 10*3/uL (ref 0.1–1.0)
NEUTROS PCT: 57 % (ref 43–77)
Neutro Abs: 2.6 10*3/uL (ref 1.7–7.7)
Platelets: 224 10*3/uL (ref 150–400)
RBC: 3.67 MIL/uL — AB (ref 3.87–5.11)
RDW: 13.7 % (ref 11.5–15.5)
WBC: 4.6 10*3/uL (ref 4.0–10.5)

## 2013-09-05 LAB — HEPATIC FUNCTION PANEL
ALBUMIN: 4.9 g/dL (ref 3.5–5.2)
ALT: 41 U/L — ABNORMAL HIGH (ref 0–35)
AST: 84 U/L — AB (ref 0–37)
Alkaline Phosphatase: 71 U/L (ref 39–117)
BILIRUBIN DIRECT: 0.2 mg/dL (ref 0.0–0.3)
BILIRUBIN TOTAL: 0.7 mg/dL (ref 0.2–1.2)
Indirect Bilirubin: 0.5 mg/dL (ref 0.2–1.2)
Total Protein: 7.6 g/dL (ref 6.0–8.3)

## 2013-09-05 MED ORDER — DIAZEPAM 5 MG PO TABS: 5.0000 mg | ORAL_TABLET | Freq: Two times a day (BID) | ORAL | Status: DC | PRN

## 2013-09-05 NOTE — Patient Instructions (Signed)
Obsessive-Compulsive Disorder Obsessive-compulsive disorder (OCD) is a mental illness. People with OCD have obsessions, compulsions, or both. Obsessions are unwanted and distressing thoughts, ideas, or urges that keep entering your mind. You may find yourself trying to ignore them. You may try to stop or undo them with a compulsion. Compulsions are repetitive physical or mental acts that you feel driven to perform. The actsmay seem senseless or excessive. They usually reduce or prevent any emotional distress. Obsessions and compulsions can be time consuming (take more than 1 hour a day). They can interfere with personal relationships and normal activities at home, school, or work.  OCD usually starts in young adulthood and continues throughout life. People who have family members with OCD are at higher risk for developing OCD. Women are at higher risk for OCD during and after pregnancy. Many people with OCD also have depression or another mental health disorder. SYMPTOMS  People with obsessions usually have a fear that something terrible will happen or that they will do something terrible. Examples of common obsessions include:   Fear of contamination with germs, bodily waste, or poisonous substances.  Fear of making the wrong decision (self-doubt).  Violent or sexual thoughts or urges towards others.  Need for symmetry or exactness. Examples of typical compulsive acts include:   Excessive hand washing or bathing due to fear of contamination.  Checking things over and over to make sure a task has been completed. For example, making sure a door is locked or a toaster is unplugged.  Repeating an act or phrase over and over, sometimes a specific number of times, until it feels right.  Arranging and rearranging objects to keep them in a certain order. DIAGNOSIS  OCD is diagnosed through an assessment by your health care provider. Your health care provider will ask questions about any obsessions  or compulsions you have and how they affect your life. Your health care provider may also ask about your medical history, prescription medicine, and drug use. Certain medical conditions and substances can cause symptoms similar to OCD. TREATMENT  The following forms of treatment are available for OCD:  Cognitive therapy. This is a form of talk therapy. The goal is to identify and change the irrational thoughts associated with obsessions.  Behavioral therapy. This is also a form of talk therapy. The goal is to target and reduce compulsive acts (behaviors). A specific type of behavioral therapy called exposure and response prevention is considered the most effective treatment for OCD. You will be exposed to the distressing situation that triggers your compulsion and prevented from responding to it. With repetition of this process over time, you will no longer feel the distress or need to perform the compulsion.   Medicine. Certain types of antidepressant medicine may help reduce or control OCD symptoms. Medication is most effective when it is used with cognitive or behavioral therapy. For severe OCD that does not respond to talk therapy and medication, brain surgery or electrical stimulation of specific areas of the brain (deep brain stimulation) may be helpful. HOME CARE INSTRUCTIONS   Take all medicines as prescribed.  Check with your health care provider before starting new prescription or over-the-counter medicines. SEEK MEDICAL CARE IF:  If you are not able to take your medicines as prescribed.  If your symptoms get worse. SEEK IMMEDIATE MEDICAL CARE IF:  You feel that any of your ideas or actions are slipping out of your control. Document Released: 05/19/2001 Document Revised: 03/15/2013 Document Reviewed: 01/06/2013 Exit[employer] Patient Information 2014   [employer]. Post-traumatic Stress You have post-traumatic stress disorder (PTSD). This condition causes many different symptoms  including: emotional outbursts, anxiety, sleeping problems, social withdrawal, and drug abuse. PTSD often follows a particularly traumatic event such as war, or natural disasters like hurricanes, earthquakes, or floods. It can also be seen after personal traumas such as accidents, rape, or the death of someone you love. Symptoms may be delayed for days or even years. Emotional numbing and the inability to feel your emotions, may be the earliest sign. Periods of agitation, aggression, and inability to perform ordinary tasks are common with PTSD. Nightmares and daytime memories of the trauma often bring on uncontrolled symptoms. Sufferers typically startle easily and avoid reminders of the trauma. Panic attacks, feelings of extreme guilt, and blackouts are often reported. Treatment is very helpful, especially group therapy. Healing happens when emotional traumas are shared with others who have a sympathetic ear. The [facility] have helped over 185,000 veterans with this problem. Medication is also very effective. The symptoms can become chronic and lifelong, so it is important to get help. Call your caregiver or a counselor who deals with this type of problem for further assistance. Document Released: 07/02/2004 Document Revised: 08/17/2011 Document Reviewed: 05/25/2005 [facility] Patient Information 2014 [place], [place]. Generalized Anxiety Disorder Generalized anxiety disorder (GAD) is a mental disorder. It interferes with life functions, including relationships, work, and school. GAD is different from normal anxiety, which everyone experiences at some point in their lives in response to specific life events and activities. Normal anxiety actually helps [place] prepare for and get through these life events and activities. Normal anxiety goes away after the event or activity is over.  GAD causes anxiety that is not necessarily related to specific events or activities. It also causes excess  anxiety in proportion to specific events or activities. The anxiety associated with GAD is also difficult to control. GAD can vary from mild to severe. People with severe GAD can have intense waves of anxiety with physical symptoms (panic attacks).  SYMPTOMS The anxiety and worry associated with GAD are difficult to control. This anxiety and worry are related to many life events and activities and also occur more days than not for 6 months or longer. People with GAD also have three or more of the following symptoms (one or more in children):  Restlessness.   Fatigue.  Difficulty concentrating.   Irritability.  Muscle tension.  Difficulty sleeping or unsatisfying sleep. DIAGNOSIS GAD is diagnosed through an assessment by your caregiver. Your caregiver will ask you questions aboutyour mood,physical symptoms, and events in your life. Your caregiver may ask you about your medical history and use of alcohol or drugs, including prescription medications. Your caregiver may also do a physical exam and blood tests. Certain medical conditions and the use of certain substances can cause symptoms similar to those associated with GAD. Your caregiver may refer you to a mental health specialist for further evaluation. TREATMENT The following therapies are usually used to treat GAD:   Medication Antidepressant medication usually is prescribed for long-term daily control. Antianxiety medications may be added in severe cases, especially when panic attacks occur.   Talk therapy (psychotherapy) Certain types of talk therapy can be helpful in treating GAD by providing support, education, and guidance. A form of talk therapy called cognitive behavioral therapy can teach you healthy ways to think about and react to daily life events and activities.  Stress managementtechniques These include yoga, meditation, and exercise and can  be very helpful when they are practiced regularly. A mental health specialist  can help determine which treatment is best for you. Some people see improvement with one therapy. However, other people require a combination of therapies. Document Released: 09/19/2012 Document Reviewed: 09/19/2012 [facility] Patient Information 2014 [place]ExitCare, [place]LLC.

## 2013-09-05 NOTE — Progress Notes (Signed)
Subjective:    Patient ID: Stacy [name], female    DOB: 09/17/1960, 53 y.o.   MRN: [id number]  HPI Comments: 53 yo tearful WF comes in for close f/u with RX change to Zoloft at patient's request. She has not worked in 2 months due to her anxiety/ scattered head and depression. She has tried to decrease ETOH. She notes she is not sleeping and has yet again used up all of her Xanax and took 125 mg of benadryl last night and still could not sleep. She has not been diagnosed with bipolar but does have family history. She notes sister is having to help her pay bills. She did not F/u AD with Psych or counselor, noted she had scheduling conflicts event though off work x 2 months. She notes she is doing better with medication compliance but still misses days occasionally. She notes increased anxiety with chest tightness and does not want to leave her home. She notes anxiety increases palpitations which is why she used up her xanax instead of adding nadolol or calling in for advise.   Current Outpatient Prescriptions on File Prior to Visit  Medication Sig Dispense Refill  . acyclovir (ZOVIRAX) 200 MG capsule Take 200 mg by mouth 2 (two) times daily as needed.       . ALPRAZolam (XANAX) 0.5 MG tablet Take 1 tablet (0.5 mg total) by mouth 3 (three) times daily as needed for anxiety.  90 tablet  0  . eszopiclone (LUNESTA) 2 MG TABS tablet Take 1 tablet (2 mg total) by mouth at bedtime as needed for sleep. Take immediately before bedtime  30 tablet  0  . fluticasone (FLONASE) 50 MCG/ACT nasal spray Place 2 sprays into the nose daily.      Marland Kitchen. levocetirizine (XYZAL) 5 MG tablet Take 5 mg by mouth daily as needed.       Marland Kitchen. levothyroxine (SYNTHROID, LEVOTHROID) 88 MCG tablet Take 88 mcg by mouth daily.      . nadolol (CORGARD) 20 MG tablet Take 20 mg by mouth 2 (two) times daily.      . sertraline (ZOLOFT) 50 MG tablet Take 1 tablet (50 mg total) by mouth daily.  30 tablet  2   No current facility-administered  medications on file prior to visit.   Allergies  Allergen Reactions  . Ambien [Zolpidem Tartrate] Other (See Comments)    hallucinations   Past Medical History  Diagnosis Date  . Hypertension   . Anxiety   . Depression   . Unspecified hypothyroidism   . Insomnia   . HSV (herpes simplex virus) infection   . Allergy   . Hyperlipidemia       Review of Systems  Constitutional: Positive for fatigue.  Psychiatric/Behavioral: Positive for behavioral problems, confusion, sleep disturbance and decreased concentration. Negative for suicidal ideas and self-injury. The patient is nervous/anxious.   All other systems reviewed and are negative.   BP 124/70  Pulse 68  Temp(Src) 98.2 F (36.8 C) (Temporal)  Resp 16  Ht 5\' 3"  (1.6 m)  Wt 112 lb (50.803 kg)  BMI 19.84 kg/m2     Objective:   Physical Exam  Nursing note and vitals reviewed. Constitutional: She is oriented to person, place, and time. She appears well-developed and well-nourished. No distress.  HENT:  Head: Normocephalic and atraumatic.  Right Ear: External ear normal.  Left Ear: External ear normal.  Nose: Nose normal.  Mouth/Throat: Oropharynx is clear and moist.  Eyes: Conjunctivae and EOM are normal.  Neck: Normal range of motion. Neck supple. No JVD present. No thyromegaly present.  Cardiovascular: Normal rate, regular rhythm, normal heart sounds and intact distal pulses.   Pulmonary/Chest: Effort normal and breath sounds normal.  Abdominal: Soft. Bowel sounds are normal. She exhibits no distension and no mass. There is no tenderness. There is no rebound and no guarding.  Musculoskeletal: Normal range of motion. She exhibits no edema and no tenderness.  Lymphadenopathy:    She has no cervical adenopathy.  Neurological: She is alert and oriented to person, place, and time. No cranial nerve deficit.  Skin: Skin is warm and dry. No rash noted. No erythema. No pallor.  Psychiatric: Judgment and thought content  normal.  Tearful and rambling          Assessment & Plan:  1. Anxiety vs bipolar with memory changes vs mood disorder- Advised to call for Psych appointment ASAP. w/c if SX increase or ER. Check CT head. Recheck labs.Advised OOW x 1 week, NEEDS TO TAKE RESPONSIBILITY FOR HER SELF, DECREASE ETOH AND TAKE RX AD DAILY. Long discussion about how to manage stressors, medicines and family better. w/c if SX increase or ER. List of several Psych doctors with phone # given to patient. Change Xanax to Valium 5 mg to help with sleep anxiety/ ETOH. Try to take 1/2 BID.  2. Hypothyroid- Recheck with recent palpitations,Ref Enocrinologists

## 2013-09-06 ENCOUNTER — Other Ambulatory Visit: Payer: Self-pay | Admitting: Emergency Medicine

## 2013-09-06 DIAGNOSIS — R6889 Other general symptoms and signs: Secondary | ICD-10-CM

## 2013-09-06 DIAGNOSIS — Z91148 Patient's other noncompliance with medication regimen for other reason: Secondary | ICD-10-CM | POA: Insufficient documentation

## 2013-09-06 DIAGNOSIS — Z9114 Patient's other noncompliance with medication regimen: Secondary | ICD-10-CM

## 2013-09-06 LAB — TSH: TSH: 3.349 u[IU]/mL (ref 0.350–4.500)

## 2013-09-07 ENCOUNTER — Ambulatory Visit: Payer: Self-pay | Admitting: Emergency Medicine

## 2013-09-07 ENCOUNTER — Ambulatory Visit
Admission: RE | Admit: 2013-09-07 | Discharge: 2013-09-07 | Disposition: A | Discharge status: Home / Self Care | Payer: BC Managed Care – PPO | Source: Ambulatory Visit | Attending: Emergency Medicine | Admitting: Emergency Medicine

## 2013-09-07 ENCOUNTER — Other Ambulatory Visit: Payer: Self-pay | Admitting: Emergency Medicine

## 2013-09-07 DIAGNOSIS — R5381 Other malaise: Secondary | ICD-10-CM

## 2013-09-07 DIAGNOSIS — R413 Other amnesia: Secondary | ICD-10-CM

## 2013-09-07 DIAGNOSIS — R5383 Other fatigue: Secondary | ICD-10-CM

## 2013-09-07 DIAGNOSIS — R6889 Other general symptoms and signs: Secondary | ICD-10-CM

## 2013-09-14 ENCOUNTER — Telehealth: Payer: Self-pay

## 2013-09-14 ENCOUNTER — Ambulatory Visit: Payer: Self-pay | Admitting: Emergency Medicine

## 2013-09-14 NOTE — Telephone Encounter (Signed)
Pt called asking for refill for sleep Rx. Pt states Rx not due for refill but she has not been taking Rx AD. I told pt we could not refill Rx until due date. Also told pt you could not refill any Rx until seen by psych.

## 2013-09-19 ENCOUNTER — Ambulatory Visit: Payer: BC Managed Care – PPO | Admitting: Endocrinology

## 2013-09-19 ENCOUNTER — Other Ambulatory Visit: Payer: Self-pay | Admitting: Emergency Medicine

## 2013-09-19 MED ORDER — NADOLOL 20 MG PO TABS: 20.0000 mg | ORAL_TABLET | Freq: Two times a day (BID) | ORAL | Status: DC

## 2013-09-19 MED ORDER — LEVOTHYROXINE SODIUM 88 MCG PO TABS: 88.0000 ug | ORAL_TABLET | Freq: Every day | ORAL | Status: DC

## 2013-09-27 ENCOUNTER — Ambulatory Visit: Payer: BC Managed Care – PPO | Admitting: Endocrinology

## 2013-09-29 ENCOUNTER — Ambulatory Visit: Payer: BC Managed Care – PPO | Admitting: Endocrinology

## 2013-10-06 ENCOUNTER — Ambulatory Visit: Payer: BC Managed Care – PPO | Admitting: Endocrinology

## 2013-10-09 ENCOUNTER — Telehealth: Payer: Self-pay | Admitting: *Deleted

## 2013-10-09 NOTE — Telephone Encounter (Signed)
If patient is have severe confusion, suggest her going to the ER.

## 2013-10-09 NOTE — Telephone Encounter (Signed)
Pt is messing with meds weaning herself of meds? Even bp Meds, said she is taking zoloft. Having severe confussion & cant focus & anxiety.  Says she wants to wait for psych till after she sees the endo DR ? Feels shes on to many meds but then said should she send in for a refill on diazepam? Not sure what pt needs or further instructions ( please advise )

## 2013-10-09 NOTE — Telephone Encounter (Signed)
Please advise.  Patient was advised by Stacy [name] that she MUST follow up with psych.  [name] stated she was unable to manage meds especially when patient does not take as directed.

## 2013-10-10 NOTE — Telephone Encounter (Signed)
Left message on voicemail at 4:56pm.

## 2013-10-10 NOTE — Telephone Encounter (Signed)
Left message on machine at 8:51am to return call.

## 2013-10-10 NOTE — Telephone Encounter (Signed)
Received written note from the front to call patient after 3:00pm.  Called patient at 3:15pm but mailbox full so not able to leave voicemail message.

## 2013-10-11 ENCOUNTER — Ambulatory Visit (INDEPENDENT_AMBULATORY_CARE_PROVIDER_SITE_OTHER): Payer: BC Managed Care – PPO | Admitting: Endocrinology

## 2013-10-11 ENCOUNTER — Encounter: Payer: Self-pay | Admitting: Endocrinology

## 2013-10-11 VITALS — BP 138/90 | HR 69 | Temp 97.8°F | Ht 63.0 in | Wt 113.0 lb

## 2013-10-11 DIAGNOSIS — E871 Hypo-osmolality and hyponatremia: Secondary | ICD-10-CM

## 2013-10-11 DIAGNOSIS — E890 Postprocedural hypothyroidism: Secondary | ICD-10-CM

## 2013-10-11 NOTE — Progress Notes (Signed)
Subjective:    Patient ID: Stacy [name], female    DOB: 07/04/1960, 53 y.o.   MRN: [id number]  HPI Pt says she had I-131 rx for hyperthyroidism, due to grave's dz, approx 2000.  She has been on synthroid since soon thereafter.  she does not take non-prescribed thyroid hormone therapy.   He has never had thyroid surgery, or XRT to the neck.  He has never been on amiodarone or lithium.  She has moderate palpitations in the chest, and assoc anxiety.  She has been on this same synthroid dosage x approx 1 year.   She has no h/o  Acei, duiretic, adrenal insuff, siadh, cirrhosis, CHF, nephrotic synd, brain injury, hypertrigliceridemia, pulm dz, cancer, narcotics, GBS.    Past Medical History  Diagnosis Date  . Hypertension   . Anxiety   . Depression   . Unspecified hypothyroidism   . Insomnia   . HSV (herpes simplex virus) infection   . Allergy   . Hyperlipidemia     Past Surgical History  Procedure Laterality Date  . Cosmetic surgery      breast implants saline  . Colposcopy      History   Social History  . Marital Status: Divorced    Spouse Name: N/A    Number of Children: N/A  . Years of Education: N/A   Occupational History  . Not on file.   Social History Main Topics  . Smoking status: Former Smoker -- 1.00 packs/day    Quit date: 06/08/2006  . Smokeless tobacco: Not on file  . Alcohol Use: 2.4 oz/week    4 Glasses of wine per week  . Drug Use: No  . Sexual Activity: Not on file   Other Topics Concern  . Not on file   Social History Narrative  . No narrative on file    Current Outpatient Prescriptions on File Prior to Visit  Medication Sig Dispense Refill  . acyclovir (ZOVIRAX) 200 MG capsule Take 200 mg by mouth 2 (two) times daily as needed.       . diazepam (VALIUM) 5 MG tablet Take 1 tablet (5 mg total) by mouth every 12 (twelve) hours as needed for anxiety.  60 tablet  0  . eszopiclone (LUNESTA) 2 MG TABS tablet Take 1 tablet (2 mg total) by mouth at  bedtime as needed for sleep. Take immediately before bedtime  30 tablet  0  . fluticasone (FLONASE) 50 MCG/ACT nasal spray Place 2 sprays into the nose daily.      Marland Kitchen. levocetirizine (XYZAL) 5 MG tablet Take 5 mg by mouth daily as needed.       Marland Kitchen. levothyroxine (SYNTHROID, LEVOTHROID) 88 MCG tablet Take 1 tablet (88 mcg total) by mouth daily.  90 tablet  0  . nadolol (CORGARD) 20 MG tablet Take 1 tablet (20 mg total) by mouth 2 (two) times daily.  180 tablet  0  . sertraline (ZOLOFT) 50 MG tablet Take 1 tablet (50 mg total) by mouth daily.  30 tablet  2  . ALPRAZolam (XANAX) 0.5 MG tablet Take 1 tablet (0.5 mg total) by mouth 3 (three) times daily as needed for anxiety.  90 tablet  0   No current facility-administered medications on file prior to visit.    Allergies  Allergen Reactions  . Ambien [Zolpidem Tartrate] Other (See Comments)    hallucinations    Family History  Problem Relation Age of Onset  . Stroke Mother   . Cancer Father  leukemia  . Depression Sister   . Depression Sister   . Depression Sister   . Depression Sister   . Heart disease Other   . Hyperlipidemia Other   . Diabetes Other     BP 138/90  Pulse 69  Temp(Src) 97.8 F (36.6 C) (Oral)  Ht 5\' 3"  (1.6 m)  Wt 113 lb (51.256 kg)  BMI 20.02 kg/m2  SpO2 96%  Review of Systems denies cramps, sob, weight gain, constipation, numbness, n/v/d, blurry vision, polydipsia, myalgias, dry skin, rhinorrhea, and syncope.  She has hair loss, difficulty with concentration, easy bruising, and depression.     Objective:   Physical Exam VS: see vs page GEN: no distress HEAD: head: no deformity eyes: no periorbital swelling, no proptosis external nose and ears are normal mouth: no lesion seen NECK: supple, thyroid is not enlarged CHEST WALL: no deformity LUNGS:  Clear to auscultation.  CV: reg rate and rhythm, no murmur ABD: abdomen is soft, nontender.  no hepatosplenomegaly.  not distended.  no hernia.    MUSCULOSKELETAL: muscle bulk and strength are grossly normal.  no obvious joint swelling.  gait is normal and steady EXTEMITIES: no deformity.  no ulcer on the feet.  feet are of normal color and temp.  no edema PULSES: dorsalis pedis intact bilat.  no carotid bruit NEURO:  cn 2-12 grossly intact.   readily moves all 4's.  sensation is intact to touch on the feet SKIN:  Normal texture and temperature.  No rash or suspicious lesion is visible.   NODES:  None palpable at the neck PSYCH: alert, well-oriented.  Does not appear anxious nor depressed.  Lab Results  Component Value Date   TSH 3.349 09/05/2013   Lab Results  Component Value Date   CREATININE 0.52 10/11/2013   BUN 6 10/11/2013   NA 132* 10/11/2013   K 4.7 10/11/2013   CL 93* 10/11/2013   CO2 25 10/11/2013      Assessment & Plan:  Hyponatremia: uncertain etiology.  We'll plan to do ACTH stim test on return.   Hypothyroidism: well-replaced. Depression: this could be contributing to pt's sxs. Fatigue: i agree with pt that this could be due to meds.  We discussed the recommendation from pcp to see psychiatry.  Pt says she'll consider.

## 2013-10-11 NOTE — Patient Instructions (Addendum)
Your need for thyroid hormone will probably change over time, so you should have it checked approx twice a year. blood tests are being requested for you today.  We'll contact you with results. Given your symptoms, it is important to normalize your salt level, to see if this helps.   I agree with your wish to reduce your number of medications, as they can contribute to your symptoms.  Although this is difficult to do, limiting fluid intake helps the salt level.

## 2013-10-11 NOTE — Telephone Encounter (Signed)
Spoke with patient and advised her per Amanda's orders she needs to go to ER for further eval of increased symptoms of confusion.  Patient has been advised numerous times that she needs to see psych as instructed by Loree FeeMelissa Smith, PA-C.  We have been unable to manage patient's Rx due to misuse and not following instructions as directed by [name]. Patient states she doesn't "feel" she needs to see psych and is seeing Endocrine specialists 10/11/13 and thinks they will be able to manage medications better.

## 2013-10-12 ENCOUNTER — Other Ambulatory Visit: Payer: Self-pay

## 2013-10-12 LAB — BASIC METABOLIC PANEL
BUN: 6 mg/dL (ref 6–23)
CALCIUM: 10.4 mg/dL (ref 8.4–10.5)
CO2: 25 mEq/L (ref 19–32)
Chloride: 93 mEq/L — ABNORMAL LOW (ref 96–112)
Creat: 0.52 mg/dL (ref 0.50–1.10)
GLUCOSE: 74 mg/dL (ref 70–99)
POTASSIUM: 4.7 meq/L (ref 3.5–5.3)
Sodium: 132 mEq/L — ABNORMAL LOW (ref 135–145)

## 2013-10-12 MED ORDER — DEMECLOCYCLINE HCL 300 MG PO TABS: 300.0000 mg | ORAL_TABLET | Freq: Two times a day (BID) | ORAL | Status: DC

## 2013-10-13 ENCOUNTER — Other Ambulatory Visit: Payer: Self-pay | Admitting: Internal Medicine

## 2013-10-16 ENCOUNTER — Telehealth: Payer: Self-pay | Admitting: Endocrinology

## 2013-10-16 NOTE — Telephone Encounter (Signed)
Requested call back to discuss.  

## 2013-10-16 NOTE — Telephone Encounter (Signed)
Please call pt withlabs results today please!

## 2013-10-17 ENCOUNTER — Telehealth: Payer: Self-pay | Admitting: *Deleted

## 2013-10-17 NOTE — Telephone Encounter (Signed)
Pt informed of labs

## 2013-10-23 ENCOUNTER — Telehealth: Payer: Self-pay | Admitting: Endocrinology

## 2013-10-23 LAB — ARGININE VASOPRESSIN HORMONE: Arginine Vasopressin: <1 pg/mL — ABNORMAL LOW

## 2013-10-23 NOTE — Telephone Encounter (Signed)
Pt has noticed a goiter on her neck rather large please advise  Painful and tender

## 2013-10-23 NOTE — Telephone Encounter (Signed)
Please see pcp for this

## 2013-10-23 NOTE — Telephone Encounter (Signed)
Called pt and she states that she has noticed her throat has been swelling for the past 3 days. Pt denies that she has had trouble swallowing, but she has noticed her throat is sore and tender.  Please advise, Thanks!

## 2013-10-24 ENCOUNTER — Other Ambulatory Visit: Payer: Self-pay

## 2013-10-24 DIAGNOSIS — E871 Hypo-osmolality and hyponatremia: Secondary | ICD-10-CM

## 2013-10-24 DIAGNOSIS — E890 Postprocedural hypothyroidism: Secondary | ICD-10-CM

## 2013-10-24 NOTE — Telephone Encounter (Signed)
Pt informed. Pt was also scheduled for a repeat of her lab work.

## 2013-10-25 ENCOUNTER — Other Ambulatory Visit: Payer: BC Managed Care – PPO

## 2013-11-27 ENCOUNTER — Ambulatory Visit: Payer: Self-pay | Admitting: Emergency Medicine

## 2013-11-30 ENCOUNTER — Ambulatory Visit: Payer: Self-pay | Admitting: Emergency Medicine

## 2013-12-05 ENCOUNTER — Ambulatory Visit: Payer: Self-pay | Admitting: Emergency Medicine

## 2013-12-13 ENCOUNTER — Ambulatory Visit: Payer: Self-pay | Admitting: Emergency Medicine

## 2013-12-21 ENCOUNTER — Ambulatory Visit (INDEPENDENT_AMBULATORY_CARE_PROVIDER_SITE_OTHER): Payer: BC Managed Care – PPO | Admitting: Emergency Medicine

## 2013-12-21 ENCOUNTER — Encounter: Payer: Self-pay | Admitting: Emergency Medicine

## 2013-12-21 VITALS — BP 122/80 | HR 90 | Temp 98.6°F | Resp 16 | Ht 63.0 in | Wt 110.0 lb

## 2013-12-21 DIAGNOSIS — R5383 Other fatigue: Secondary | ICD-10-CM

## 2013-12-21 DIAGNOSIS — R079 Chest pain, unspecified: Secondary | ICD-10-CM

## 2013-12-21 DIAGNOSIS — E039 Hypothyroidism, unspecified: Secondary | ICD-10-CM

## 2013-12-21 DIAGNOSIS — R6889 Other general symptoms and signs: Secondary | ICD-10-CM

## 2013-12-21 DIAGNOSIS — E782 Mixed hyperlipidemia: Secondary | ICD-10-CM

## 2013-12-21 DIAGNOSIS — R5381 Other malaise: Secondary | ICD-10-CM

## 2013-12-21 LAB — LIPID PANEL
CHOLESTEROL: 207 mg/dL — AB (ref 0–200)
HDL: 119 mg/dL (ref >39)
LDL CALC: 79 mg/dL (ref 0–99)
TRIGLYCERIDES: 43 mg/dL (ref <150)
Total CHOL/HDL Ratio: 1.7 Ratio
VLDL: 9 mg/dL (ref 0–40)

## 2013-12-21 LAB — BASIC METABOLIC PANEL WITH GFR
BUN: 7 mg/dL (ref 6–23)
CALCIUM: 9.8 mg/dL (ref 8.4–10.5)
CO2: 27 meq/L (ref 19–32)
Chloride: 98 mEq/L (ref 96–112)
Creat: 0.66 mg/dL (ref 0.50–1.10)
GFR, Est Non African American: >89 mL/min
GLUCOSE: 135 mg/dL — AB (ref 70–99)
POTASSIUM: 4.1 meq/L (ref 3.5–5.3)
SODIUM: 134 meq/L — AB (ref 135–145)

## 2013-12-21 LAB — CBC WITH DIFFERENTIAL/PLATELET
BASOS PCT: 0 % (ref 0–1)
Basophils Absolute: 0 10*3/uL (ref 0.0–0.1)
Eosinophils Absolute: 0.1 10*3/uL (ref 0.0–0.7)
Eosinophils Relative: 1 % (ref 0–5)
HCT: 37.5 % (ref 36.0–46.0)
Hemoglobin: 13.2 g/dL (ref 12.0–15.0)
Lymphocytes Relative: 24 % (ref 12–46)
Lymphs Abs: 1.4 10*3/uL (ref 0.7–4.0)
MCH: 34.7 pg — AB (ref 26.0–34.0)
MCHC: 35.2 g/dL (ref 30.0–36.0)
MCV: 98.7 fL (ref 78.0–100.0)
Monocytes Absolute: 0.5 10*3/uL (ref 0.1–1.0)
Monocytes Relative: 8 % (ref 3–12)
NEUTROS ABS: 4 10*3/uL (ref 1.7–7.7)
NEUTROS PCT: 67 % (ref 43–77)
Platelets: 224 10*3/uL (ref 150–400)
RBC: 3.8 MIL/uL — ABNORMAL LOW (ref 3.87–5.11)
RDW: 13 % (ref 11.5–15.5)
WBC: 5.9 10*3/uL (ref 4.0–10.5)

## 2013-12-21 LAB — HEPATIC FUNCTION PANEL
ALBUMIN: 4.9 g/dL (ref 3.5–5.2)
ALT: 13 U/L (ref 0–35)
AST: 24 U/L (ref 0–37)
Alkaline Phosphatase: 46 U/L (ref 39–117)
Bilirubin, Direct: 0.1 mg/dL (ref 0.0–0.3)
Indirect Bilirubin: 0.5 mg/dL (ref 0.2–1.2)
TOTAL PROTEIN: 7.4 g/dL (ref 6.0–8.3)
Total Bilirubin: 0.6 mg/dL (ref 0.2–1.2)

## 2013-12-21 MED ORDER — FLUTICASONE PROPIONATE 50 MCG/ACT NA SUSP: 2.0000 | Freq: Every day | NASAL | Status: DC

## 2013-12-21 MED ORDER — ESZOPICLONE 2 MG PO TABS: ORAL_TABLET | ORAL | Status: DC

## 2013-12-21 MED ORDER — LEVOTHYROXINE SODIUM 88 MCG PO TABS: 88.0000 ug | ORAL_TABLET | Freq: Every day | ORAL | Status: DC

## 2013-12-21 MED ORDER — ACYCLOVIR 200 MG PO CAPS: 200.0000 mg | ORAL_CAPSULE | Freq: Two times a day (BID) | ORAL | Status: DC

## 2013-12-21 MED ORDER — LEVOCETIRIZINE DIHYDROCHLORIDE 5 MG PO TABS: 5.0000 mg | ORAL_TABLET | Freq: Every day | ORAL | Status: DC | PRN

## 2013-12-21 NOTE — Progress Notes (Signed)
Subjective:    Patient ID: Stacy [name], female    DOB: [birth year], 53 y.o.   MRN: [id number]  HPI Comments: 53 yo NONcompliant hypothyroid/ anxiety patient comes in with multiple concerns.She has not been to Psych. SHe has not seen counselors or been taking medicines AD. She has been off all medicines x 2 months. She is feeling anxious. She notes she is still having brain fog. She did not f/u AD for U/s or lab recheck. She notes her mammo was WNL but still has left sided chest pain and wants to see cardiology.  WBC             4.6   09/05/2013 HGB            13.0   09/05/2013 HCT            36.6   09/05/2013 PLT             224   09/05/2013 GLUCOSE          74   10/11/2013 CHOL            241   08/14/2013 TRIG             61   08/14/2013 HDL             107   08/14/2013 LDLCALC         122   08/14/2013 ALT              41   09/05/2013 AST              84   09/05/2013 NA              132   10/11/2013 K               4.7   10/11/2013 CL               93   10/11/2013 CREATININE     0.52   10/11/2013 BUN               6   10/11/2013 CO2              25   10/11/2013 TSH           3.349   09/05/2013     Medication List       This list is accurate as of: 12/21/13  3:52 PM.  Always use your most recent med list.               acyclovir 200 MG capsule  Commonly known as:  ZOVIRAX  Take 200 mg by mouth 2 (two) times daily as needed.     eszopiclone 2 MG Tabs tablet  Commonly known as:  LUNESTA  TAKE 1 TABLET BY MOUTH AT BEDTIME IMMEDIATELY BEFORE BED     FISH OIL TRIPLE STRENGTH 1400 MG Caps  Take by mouth.     fluticasone 50 MCG/ACT nasal spray  Commonly known as:  FLONASE  Place 2 sprays into the nose daily.     levothyroxine 88 MCG tablet  Commonly known as:  SYNTHROID, LEVOTHROID  Take 88 mcg by mouth daily. Takes 1/2 pill M,W,F and 1 pill times 4 days     SUPER B COMPLEX MAXI PO  Take by mouth.       Allergies  Allergen Reactions  . Ambien [Zolpidem Tartrate] Other (See Comments)   hallucinations   Past Medical History  Diagnosis Date  .  Hypertension   . Anxiety   . Depression   . Unspecified hypothyroidism   . Insomnia   . HSV (herpes simplex virus) infection   . Allergy   . Hyperlipidemia       Review of Systems BP 122/80  Pulse 90  Temp(Src) 98.6 F (37 C) (Temporal)  Resp 16  Ht 5\' 3"  (1.6 m)  Wt 110 lb (49.896 kg)  BMI 19.49 kg/m2     Objective:   Physical Exam  Nursing note and vitals reviewed. Constitutional: She is oriented to person, place, and time. She appears well-developed and well-nourished. No distress.  HENT:  Head: Normocephalic and atraumatic.  Right Ear: External ear normal.  Left Ear: External ear normal.  Nose: Nose normal.  Mouth/Throat: Oropharynx is clear and moist.  Eyes: Conjunctivae and EOM are normal.  Neck: Normal range of motion. Neck supple. No JVD present. No thyromegaly present.  Cardiovascular: Normal rate, regular rhythm, normal heart sounds and intact distal pulses.   Pulmonary/Chest: Effort normal and breath sounds normal.  Abdominal: Soft. Bowel sounds are normal. She exhibits no distension. There is no tenderness.  Musculoskeletal: Normal range of motion. She exhibits no edema and no tenderness.  Lymphadenopathy:    She has no cervical adenopathy.  Neurological: She is alert and oriented to person, place, and time. No cranial nerve deficit.  Skin: Skin is warm and dry. No rash noted. No erythema. No pallor.  Psychiatric: Her behavior is normal. Judgment and thought content normal.  She seems more in control of thoughts and stable than last visit but is still very anxious          Assessment & Plan:  1. Fatigue- check labs, increase activity and H2O   2. Anxiety vs Menopause- Advise GYN f/u for testing. Advised again needs psych evaluation, we will not prescribe her depres  3. Left chest discomfort with family hx of heart disease vs anxiety-REF Cardio

## 2013-12-21 NOTE — Patient Instructions (Signed)
Hyponatremia ° Hyponatremia is when the salt (sodium) in your blood is low. When salt becomes low, your cells take in extra water and puff up (swell). The puffiness can happen in the whole body. It mostly affects the brain and is very serious.  °HOME CARE °· Only take medicine as told by your doctor. °· Follow any diet instructions you were given. This includes limiting how much fluid you drink. °· Keep all doctor visits for tests as told. °· Avoid alcohol and drugs. °GET HELP RIGHT AWAY IF: °· You start to twitch and shake (seize). °· You pass out (faint). °· You continue to have watery poop (diarrhea) or you throw up (vomit). °· You feel sick to your stomach (nauseous). °· You are tired (fatigued), have a headache, are confused, or feel weak. °· Your problems that first brought you to the doctor come back. °· You have trouble following your diet instructions. °MAKE SURE YOU:  °· Understand these instructions. °· Will watch your condition. °· Will get help right away if you are not doing well or get worse. °Document Released: 02/04/2011 Document Revised: 08/17/2011 Document Reviewed: 02/04/2011 °[employer]® Patient Information ©2015 [employer]. This information is not intended to replace advice given to you by your health care provider. Make sure you discuss any questions you have with your health care provider. ° °

## 2013-12-22 LAB — TSH: TSH: 1.143 u[IU]/mL (ref 0.350–4.500)

## 2014-01-15 ENCOUNTER — Encounter: Payer: Self-pay | Admitting: Emergency Medicine

## 2014-01-22 ENCOUNTER — Other Ambulatory Visit: Payer: Self-pay | Admitting: Physician Assistant

## 2014-01-22 MED ORDER — ESZOPICLONE 2 MG PO TABS: ORAL_TABLET | ORAL | Status: DC

## 2014-01-23 ENCOUNTER — Ambulatory Visit (INDEPENDENT_AMBULATORY_CARE_PROVIDER_SITE_OTHER): Payer: BC Managed Care – PPO | Admitting: Physician Assistant

## 2014-01-23 ENCOUNTER — Encounter: Payer: Self-pay | Admitting: Physician Assistant

## 2014-01-23 VITALS — BP 120/80 | HR 64 | Temp 97.7°F | Resp 16 | Wt 111.0 lb

## 2014-01-23 DIAGNOSIS — R0989 Other specified symptoms and signs involving the circulatory and respiratory systems: Secondary | ICD-10-CM

## 2014-01-23 DIAGNOSIS — Z79899 Other long term (current) drug therapy: Secondary | ICD-10-CM

## 2014-01-23 DIAGNOSIS — R0609 Other forms of dyspnea: Secondary | ICD-10-CM

## 2014-01-23 DIAGNOSIS — R002 Palpitations: Secondary | ICD-10-CM

## 2014-01-23 DIAGNOSIS — N912 Amenorrhea, unspecified: Secondary | ICD-10-CM

## 2014-01-23 DIAGNOSIS — R0600 Dyspnea, unspecified: Secondary | ICD-10-CM

## 2014-01-23 DIAGNOSIS — E039 Hypothyroidism, unspecified: Secondary | ICD-10-CM

## 2014-01-23 LAB — CBC WITH DIFFERENTIAL/PLATELET
BASOS PCT: 1 % (ref 0–1)
Basophils Absolute: 0.1 10*3/uL (ref 0.0–0.1)
Eosinophils Absolute: 0.1 10*3/uL (ref 0.0–0.7)
Eosinophils Relative: 1 % (ref 0–5)
HEMATOCRIT: 36.3 % (ref 36.0–46.0)
Hemoglobin: 12.6 g/dL (ref 12.0–15.0)
Lymphocytes Relative: 29 % (ref 12–46)
Lymphs Abs: 1.6 10*3/uL (ref 0.7–4.0)
MCH: 34.2 pg — ABNORMAL HIGH (ref 26.0–34.0)
MCHC: 34.7 g/dL (ref 30.0–36.0)
MCV: 98.6 fL (ref 78.0–100.0)
MONO ABS: 0.5 10*3/uL (ref 0.1–1.0)
Monocytes Relative: 9 % (ref 3–12)
NEUTROS ABS: 3.4 10*3/uL (ref 1.7–7.7)
NEUTROS PCT: 60 % (ref 43–77)
PLATELETS: 229 10*3/uL (ref 150–400)
RBC: 3.68 MIL/uL — ABNORMAL LOW (ref 3.87–5.11)
RDW: 13.2 % (ref 11.5–15.5)
WBC: 5.6 10*3/uL (ref 4.0–10.5)

## 2014-01-23 LAB — SEDIMENTATION RATE: Sed Rate: 1 mm/hr (ref 0–22)

## 2014-01-23 MED ORDER — ESCITALOPRAM OXALATE 20 MG PO TABS: 20.0000 mg | ORAL_TABLET | Freq: Every day | ORAL | Status: DC

## 2014-01-23 MED ORDER — ACYCLOVIR 200 MG PO CAPS: 200.0000 mg | ORAL_CAPSULE | Freq: Two times a day (BID) | ORAL | Status: DC

## 2014-01-23 NOTE — Progress Notes (Signed)
   Subjective:    Patient ID: Stacy [name], female    DOB: 08/14/1960, 53 y.o.   MRN: [id number]  HPI 53 y.o. female presents for 1 month follow up. She has a history of noncompliance and presents with multiple complaints. She has fatigue, palpitations, depression. She is suppose to be seeing a psychiatrist but has not seen one, she is suppose to call her back. She took herself off zoloft 2 months ago but restarted it 08/05.   Has been having left sided palpitations for a month, she is a flight attendent, daily, believes they are there all the time but worse when she is still, she does not work out, palpitations worse with stairs, with some mild SOB with stairs. Denies burping, water brash, GERD.    Review of Systems  Constitutional: Positive for fatigue. Negative for fever, chills, diaphoresis, activity change, appetite change and unexpected weight change.  HENT: Negative.   Respiratory: Positive for shortness of breath. Negative for apnea, cough, choking, chest tightness, wheezing and stridor.   Cardiovascular: Positive for chest pain and palpitations. Negative for leg swelling.  Gastrointestinal: Negative.   Musculoskeletal: Positive for arthralgias and myalgias. Negative for gait problem, joint swelling, neck pain and neck stiffness.  Psychiatric/Behavioral: Positive for behavioral problems, confusion, sleep disturbance and decreased concentration. Negative for suicidal ideas and self-injury. The patient is nervous/anxious.        Objective:   Physical Exam  Constitutional: She is oriented to person, place, and time. She appears well-developed and well-nourished.  HENT:  Head: Normocephalic and atraumatic.  Right Ear: External ear normal.  Left Ear: External ear normal.  Mouth/Throat: Oropharynx is clear and moist.  Eyes: Conjunctivae and EOM are normal. Pupils are equal, round, and reactive to light.  Neck: Normal range of motion. Neck supple. No thyromegaly present.   Cardiovascular: Normal rate, regular rhythm and normal heart sounds.  Exam reveals no gallop and no friction rub.   No murmur heard. Pulmonary/Chest: Effort normal and breath sounds normal. No respiratory distress. She has no wheezes.  Abdominal: Soft. Bowel sounds are normal. She exhibits no distension and no mass. There is no tenderness. There is no rebound and no guarding.  Musculoskeletal: Normal range of motion.  Lymphadenopathy:    She has no cervical adenopathy.  Neurological: She is alert and oriented to person, place, and time. She displays normal reflexes. No cranial nerve deficit. Coordination normal.  Skin: Skin is warm and dry.  Psychiatric: She has a normal mood and affect.  Very anxious       Assessment & Plan:  Questionable palpitations, fatigue- atypicall-  does not sound like GERD but will do samples to rule out esophageal spasm - low risk PE but with frequent flying will get Ddimer - obvious anxiety needs to be addressed with some hot flashes, check FSH and will try lexapro 20mg  rather than zoloft.  Suggest follow up with psych, she has been saying for several months she will, long discussion about needing counseling.  Suggest follow up with cardio.

## 2014-01-23 NOTE — Patient Instructions (Signed)

## 2014-01-24 LAB — BASIC METABOLIC PANEL WITH GFR
BUN: 8 mg/dL (ref 6–23)
CO2: 24 mEq/L (ref 19–32)
Calcium: 10.1 mg/dL (ref 8.4–10.5)
Chloride: 97 mEq/L (ref 96–112)
Creat: 0.57 mg/dL (ref 0.50–1.10)
GFR, Est Non African American: >89 mL/min
Glucose, Bld: 102 mg/dL — ABNORMAL HIGH (ref 70–99)
Potassium: 3.7 mEq/L (ref 3.5–5.3)
Sodium: 131 mEq/L — ABNORMAL LOW (ref 135–145)

## 2014-01-24 LAB — HEPATIC FUNCTION PANEL
ALBUMIN: 5.1 g/dL (ref 3.5–5.2)
ALT: 16 U/L (ref 0–35)
AST: 28 U/L (ref 0–37)
Alkaline Phosphatase: 47 U/L (ref 39–117)
BILIRUBIN TOTAL: 0.6 mg/dL (ref 0.2–1.2)
Bilirubin, Direct: 0.1 mg/dL (ref 0.0–0.3)
Indirect Bilirubin: 0.5 mg/dL (ref 0.2–1.2)
TOTAL PROTEIN: 7.6 g/dL (ref 6.0–8.3)

## 2014-01-24 LAB — MAGNESIUM: MAGNESIUM: 1.7 mg/dL (ref 1.5–2.5)

## 2014-01-24 LAB — TSH: TSH: 1.404 u[IU]/mL (ref 0.350–4.500)

## 2014-01-24 LAB — FOLLICLE STIMULATING HORMONE: FSH: 65.5 m[IU]/mL

## 2014-01-24 LAB — VITAMIN B12: Vitamin B-12: 292 pg/mL (ref 211–911)

## 2014-01-24 LAB — D-DIMER, QUANTITATIVE: D-Dimer, Quant: 0.27 ug/mL-FEU (ref 0.00–0.48)

## 2014-01-24 MED ORDER — LEVOTHYROXINE SODIUM 75 MCG PO TABS: 75.0000 ug | ORAL_TABLET | Freq: Every day | ORAL | Status: DC

## 2014-01-24 NOTE — Addendum Note (Signed)
Addended by: Quentin MullingOLLIER, Leane Loring R on: 01/24/2014 08:13 AM   Modules accepted: Orders

## 2014-02-01 ENCOUNTER — Ambulatory Visit: Payer: Self-pay | Admitting: Physician Assistant

## 2014-02-07 ENCOUNTER — Ambulatory Visit: Payer: Self-pay | Admitting: Physician Assistant

## 2014-02-08 ENCOUNTER — Encounter: Payer: Self-pay | Admitting: *Deleted

## 2014-02-09 ENCOUNTER — Institutional Professional Consult (permissible substitution): Payer: BC Managed Care – PPO | Admitting: Cardiology

## 2014-02-20 ENCOUNTER — Other Ambulatory Visit: Payer: Self-pay | Admitting: Physician Assistant

## 2014-03-08 ENCOUNTER — Encounter: Payer: Self-pay | Admitting: Physician Assistant

## 2014-03-08 ENCOUNTER — Ambulatory Visit (INDEPENDENT_AMBULATORY_CARE_PROVIDER_SITE_OTHER): Payer: BC Managed Care – PPO | Admitting: Physician Assistant

## 2014-03-08 VITALS — BP 150/86 | HR 76 | Temp 98.6°F | Resp 18 | Ht 63.0 in | Wt 116.0 lb

## 2014-03-08 DIAGNOSIS — J320 Chronic maxillary sinusitis: Secondary | ICD-10-CM

## 2014-03-08 DIAGNOSIS — F419 Anxiety disorder, unspecified: Secondary | ICD-10-CM

## 2014-03-08 DIAGNOSIS — E871 Hypo-osmolality and hyponatremia: Secondary | ICD-10-CM

## 2014-03-08 DIAGNOSIS — E038 Other specified hypothyroidism: Secondary | ICD-10-CM

## 2014-03-08 DIAGNOSIS — E538 Deficiency of other specified B group vitamins: Secondary | ICD-10-CM

## 2014-03-08 LAB — BASIC METABOLIC PANEL WITH GFR
BUN: 10 mg/dL (ref 6–23)
CALCIUM: 9.8 mg/dL (ref 8.4–10.5)
CO2: 24 meq/L (ref 19–32)
CREATININE: 0.62 mg/dL (ref 0.50–1.10)
Chloride: 102 mEq/L (ref 96–112)
GFR, Est African American: >89 mL/min
GFR, Est Non African American: >89 mL/min
GLUCOSE: 72 mg/dL (ref 70–99)
Potassium: 4.4 mEq/L (ref 3.5–5.3)
Sodium: 137 mEq/L (ref 135–145)

## 2014-03-08 LAB — TSH: TSH: 3.413 u[IU]/mL (ref 0.350–4.500)

## 2014-03-08 LAB — MAGNESIUM: Magnesium: 1.8 mg/dL (ref 1.5–2.5)

## 2014-03-08 LAB — VITAMIN B12: Vitamin B-12: 365 pg/mL (ref 211–911)

## 2014-03-08 MED ORDER — AZITHROMYCIN 250 MG PO TABS: ORAL_TABLET | ORAL | Status: DC

## 2014-03-08 MED ORDER — ALPRAZOLAM 0.5 MG PO TABS: ORAL_TABLET | ORAL | Status: DC

## 2014-03-08 MED ORDER — ESZOPICLONE 3 MG PO TABS: ORAL_TABLET | ORAL | Status: DC

## 2014-03-08 NOTE — Patient Instructions (Addendum)
Take the Lunesta JUST at night/in [place] Take a full xanax for the nap in [place] And can take xanax if you are very very anxious BUT you should NOT take the xanax every day. It is only AS NEEDED The lexapro is an antidepressant/hot flashes and this is ONCE DAILY  Take the new levothyroxine 75mcg ONCE DAILY

## 2014-03-08 NOTE — Progress Notes (Signed)
HPI 53 y.o.female presents for 1 month follow up. Last visit she was switched to Levothyroxine 75mcg daily but she was confused, she still has only been taking a 1/2 M,W,F and 1 the rest. She is on the lexapro and off the zoloft, she states it is helping some with her hot flashes but she will go to [place]. She complains of sinus infection, she has sinus headache, nasal congestion. She ran out of her lunesta.   Past Medical History  Diagnosis Date  . Hypertension   . Anxiety   . Depression   . Unspecified hypothyroidism   . Insomnia   . HSV (herpes simplex virus) infection   . Allergy   . Hyperlipidemia      Allergies  Allergen Reactions  . Ambien [Zolpidem Tartrate] Other (See Comments)    hallucinations      Current Outpatient Prescriptions on File Prior to Visit  Medication Sig Dispense Refill  . acyclovir (ZOVIRAX) 200 MG capsule Take 1 capsule (200 mg total) by mouth 2 (two) times daily.  60 capsule  3  . B Complex-Folic Acid (SUPER B COMPLEX MAXI PO) Take by mouth.      . escitalopram (LEXAPRO) 20 MG tablet Take 1 tablet (20 mg total) by mouth daily.  30 tablet  2  . eszopiclone (LUNESTA) 2 MG TABS tablet take 1 tablet by mouth at bedtime if needed for sleep  30 tablet  0  . fluticasone (FLONASE) 50 MCG/ACT nasal spray Place 2 sprays into both nostrils daily.  16 g  3  . levocetirizine (XYZAL) 5 MG tablet Take 1 tablet (5 mg total) by mouth daily as needed.  30 tablet  6  . levothyroxine (SYNTHROID) 75 MCG tablet Take 1 tablet (75 mcg total) by mouth daily.  90 tablet  1  . Omega-3 Fatty Acids (FISH OIL TRIPLE STRENGTH) 1400 MG CAPS Take by mouth.       No current facility-administered medications on file prior to visit.    ROS: all negative expect above.   Physical: Filed Weights   03/08/14 1518  Weight: 116 lb (52.617 kg)   BP 150/86  Pulse 76  Temp(Src) 98.6 F (37 C) (Temporal)  Resp 18  Ht 5\' 3"  (1.6 m)  Wt 116 lb (52.617 kg)  BMI 20.55 kg/m2 General  Appearance: Well nourished, in no apparent distress. Eyes: PERRLA, EOMs. Sinuses: No Frontal/maxillary tenderness ENT/Mouth: Ext aud canals clear, normal light reflex with TMs without erythema, bulging. Post pharynx without erythema, swelling, exudate.  Respiratory: CTAB Cardio: RRR, no murmurs, rubs or gallops. Peripheral pulses brisk and equal bilaterally, without edema. No aortic or femoral bruits. Abdomen: Soft, with bowl sounds. Nontender, no guarding, rebound. Lymphatics: Non tender without lymphadenopathy.  Musculoskeletal: Full ROM all peripheral extremities, 5/5 strength, and normal gait. Skin: Warm, dry without rashes, lesions, ecchymosis.  Neuro: Cranial nerves intact, reflexes equal bilaterally. Normal muscle tone, no cerebellar symptoms. Sensation intact.  Pysch: Awake and oriented X 3, normal affect, Insight and Judgment appropriate.   Assessment and Plan: Anxiety- continue lexapro- follow up with Dr. Ernestina PennaFogleman.  Hypothyroidism with dose confusion- get on 75mcg daily B12 def- check B12 Insomnia/sleep work disorder- Lunesta 3mg  for sleep Hyponatremia- check sodium especially with switch of lexapro  Spent a long time going over medication with the patient and wrote out everything to avoid confusion.

## 2014-03-12 ENCOUNTER — Telehealth: Payer: Self-pay

## 2014-03-12 ENCOUNTER — Other Ambulatory Visit: Payer: Self-pay | Admitting: Physician Assistant

## 2014-03-12 MED ORDER — LEVOFLOXACIN 500 MG PO TABS: 500.0000 mg | ORAL_TABLET | Freq: Every day | ORAL | Status: AC

## 2014-03-12 NOTE — Telephone Encounter (Signed)
Received paper note from front office staff, patient complains that she is no better after taking zpack, Quentin MullingAmanda Collier, PA sent in Levaquin a stronger antibiotic , unable to leave message for patient at number provided as the voice mail box is full [phone].

## 2014-03-19 ENCOUNTER — Telehealth: Payer: Self-pay

## 2014-03-19 NOTE — Telephone Encounter (Signed)
Received paper note from front office staff, patient calling because she is not sure what Lexapro or Levaquin are and wants to know if she is suppose to be taking them, I returned call to paient and advised her that the Levaquin was written per her request for a stronger antibiotic on 03-12-14 , she was also advised the Lexapro wa to replace the Zoloft and was prescribed at her August visit, patient states she was taking correctly and verbalized understanding

## 2014-03-27 ENCOUNTER — Ambulatory Visit: Payer: Self-pay | Admitting: Physician Assistant

## 2014-04-29 ENCOUNTER — Encounter: Payer: Self-pay | Admitting: *Deleted

## 2014-05-14 ENCOUNTER — Ambulatory Visit (INDEPENDENT_AMBULATORY_CARE_PROVIDER_SITE_OTHER): Payer: BC Managed Care – PPO | Admitting: Physician Assistant

## 2014-05-14 ENCOUNTER — Encounter: Payer: Self-pay | Admitting: Physician Assistant

## 2014-05-14 VITALS — BP 138/82 | HR 98 | Temp 98.2°F | Resp 18 | Ht 63.0 in | Wt 114.0 lb

## 2014-05-14 DIAGNOSIS — R110 Nausea: Secondary | ICD-10-CM

## 2014-05-14 DIAGNOSIS — F411 Generalized anxiety disorder: Secondary | ICD-10-CM

## 2014-05-14 DIAGNOSIS — F329 Major depressive disorder, single episode, unspecified: Secondary | ICD-10-CM

## 2014-05-14 DIAGNOSIS — F32A Depression, unspecified: Secondary | ICD-10-CM

## 2014-05-14 MED ORDER — OMEPRAZOLE 10 MG PO CPDR: 10.0000 mg | DELAYED_RELEASE_CAPSULE | Freq: Every day | ORAL | Status: DC

## 2014-05-14 MED ORDER — ALPRAZOLAM 0.5 MG PO TABS: ORAL_TABLET | ORAL | Status: DC

## 2014-05-14 MED ORDER — ONDANSETRON 8 MG PO TBDP: 8.0000 mg | ORAL_TABLET | Freq: Three times a day (TID) | ORAL | Status: DC | PRN

## 2014-05-14 NOTE — Patient Instructions (Addendum)
-Take zofran - 1 tablet every 8 hours for nausea.  Let tablet dissolve on tongue and then swallow. -Take Xanax as prescribed for sleep- 1/2 to 1 tablet at bedtime. -Take Prilosec daily for acid reduction.- 1 tablet at bedtime. Make sure you are drinking plenty of fluids to stay hydrated. BRAT diet- bananas, rice, applesauce, toast  If you are not feeling better in 7-10 days, then please call the office. Please keep your physical on 06/04/14   Viral Gastroenteritis Viral gastroenteritis is also known as stomach flu. This condition affects the stomach and intestinal tract. It can cause sudden diarrhea and vomiting. The illness typically lasts 3 to 8 days. Most people develop an immune response that eventually gets rid of the virus. While this natural response develops, the virus can make you quite ill. CAUSES  Many different viruses can cause gastroenteritis, such as rotavirus or noroviruses. You can catch one of these viruses by consuming contaminated food or water. You may also catch a virus by sharing utensils or other personal items with an infected person or by touching a contaminated surface. SYMPTOMS  The most common symptoms are diarrhea and vomiting. These problems can cause a severe loss of body fluids (dehydration) and a body salt (electrolyte) imbalance. Other symptoms may include:  Fever.  Headache.  Fatigue.  Abdominal pain. DIAGNOSIS  Your caregiver can usually diagnose viral gastroenteritis based on your symptoms and a physical exam. A stool sample may also be taken to test for the presence of viruses or other infections. TREATMENT  This illness typically goes away on its own. Treatments are aimed at rehydration. The most serious cases of viral gastroenteritis involve vomiting so severely that you are not able to keep fluids down. In these cases, fluids must be given through an intravenous line (IV). HOME CARE INSTRUCTIONS   Drink enough fluids to keep your urine clear or  pale yellow. Drink small amounts of fluids frequently and increase the amounts as tolerated.  Ask your caregiver for specific rehydration instructions.  Avoid:  Foods high in sugar.  Alcohol.  Carbonated drinks.  Tobacco.  Juice.  Caffeine drinks.  Extremely hot or cold fluids.  Fatty, greasy foods.  Too much intake of anything at one time.  Dairy products until 24 to 48 hours after diarrhea stops.  You may consume probiotics. Probiotics are active cultures of beneficial bacteria. They may lessen the amount and number of diarrheal stools in adults. Probiotics can be found in yogurt with active cultures and in supplements.  Wash your hands well to avoid spreading the virus.  Only take over-the-counter or prescription medicines for pain, discomfort, or fever as directed by your caregiver. Do not give aspirin to children. Antidiarrheal medicines are not recommended.  Ask your caregiver if you should continue to take your regular prescribed and over-the-counter medicines.  Keep all follow-up appointments as directed by your caregiver. SEEK IMMEDIATE MEDICAL CARE IF:   You are unable to keep fluids down.  You do not urinate at least once every 6 to 8 hours.  You develop shortness of breath.  You notice blood in your stool or vomit. This may look like coffee grounds.  You have abdominal pain that increases or is concentrated in one small area (localized).  You have persistent vomiting or diarrhea.  You have a fever.  The patient is a child younger than 3 months, and he or she has a fever.  The patient is a child older than 3 months, and he or  she has a fever and persistent symptoms.  The patient is a child older than 3 months, and he or she has a fever and symptoms suddenly get worse.  The patient is a baby, and he or she has no tears when crying. MAKE SURE YOU:   Understand these instructions.  Will watch your condition.  Will get help right away if you are  not doing well or get worse. Document Released: 05/25/2005 Document Revised: 08/17/2011 Document Reviewed: 03/11/2011 [facility] Patient Information 2015 [place], [place]. This information is not intended to replace advice given to you by your health care provider. Make sure you discuss any questions you have with your health care provider.  Generalized Anxiety Disorder Generalized anxiety disorder (GAD) is a mental disorder. It interferes with life functions, including relationships, work, and school. GAD is different from normal anxiety, which everyone experiences at some point in their lives in response to specific life events and activities. Normal anxiety actually helps us prepare for and get through these life events and activities. Normal anxiety goes away after the event or activity is over.  GAD causes anxiety that is not necessarily related to specific events or activities. It also causes excess anxiety in proportion to specific events or activities. The anxiety associated with GAD is also difficult to control. GAD can vary from mild to severe. People with severe GAD can have intense waves of anxiety with physical symptoms (panic attacks).  SYMPTOMS The anxiety and worry associated with GAD are difficult to control. This anxiety and worry are related to many life events and activities and also occur more days than not for 6 months or longer. People with GAD also have three or more of the following symptoms (one or more in children):  Restlessness.   Fatigue.  Difficulty concentrating.   Irritability.  Muscle tension.  Difficulty sleeping or unsatisfying sleep. DIAGNOSIS GAD is diagnosed through an assessment by your health care provider. Your health care provider will ask you questions aboutyour mood,physical symptoms, and events in your life. Your health care provider may ask you about your medical history and use of alcohol or drugs, including prescription medicines. Your health  care provider may also do a physical exam and blood tests. Certain medical conditions and the use of certain substances can cause symptoms similar to those associated with GAD. Your health care provider may refer you to a mental health specialist for further evaluation. TREATMENT The following therapies are usually used to treat GAD:   Medication. Antidepressant medication usually is prescribed for long-term daily control. Antianxiety medicines may be added in severe cases, especially when panic attacks occur.   Talk therapy (psychotherapy). Certain types of talk therapy can be helpful in treating GAD by providing support, education, and guidance. A form of talk therapy called cognitive behavioral therapy can teach you healthy ways to think about and react to daily life events and activities.  Stress managementtechniques. These include yoga, meditation, and exercise and can be very helpful when they are practiced regularly. A mental health specialist can help determine which treatment is best for you. Some people see improvement with one therapy. However, other people require a combination of therapies. Document Released: 09/19/2012 Document Revised: 10/09/2013 Document Reviewed: 09/19/2012 [facility]Exit[facility] Patient Information 2015 [place], [place]. This information is not intended to replace advice given to you by your health care provider. Make sure you discuss any questions you have with your health care provider.

## 2014-05-14 NOTE — Progress Notes (Signed)
Subjective:    Patient ID: Stacy [name], female    DOB: 09/03/1960, 53 y.o.   MRN: [id number]  HPI A 53yo Caucasian female presents to the office due to nausea that started 2 days ago.  Patient states it is constant.  She states she has not been able to eat anything.  She states she has tried Weyerhaeuser CompanyPepto Bismol this morning and ginger ale.  She reports nausea and belching.  She states she ate eggs last night and has not felt good since then.  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  She reports that she last worked either the end of November or early December; she states she cannot remember.  She reports a lot of anxiety lately due to family issues.  Patient states she sees counselor to talk about it. GFR= >89 on 03/08/14 Review of Systems  Constitutional: Positive for chills. Negative for fever, diaphoresis and fatigue.  HENT: Negative for congestion, ear discharge, ear pain, postnasal drip, rhinorrhea, sinus pressure, sore throat and trouble swallowing.   Eyes: Negative.   Respiratory: Negative.   Cardiovascular: Negative.   Gastrointestinal: Positive for nausea and constipation. Negative for vomiting, abdominal pain and diarrhea.  Genitourinary: Negative.   Musculoskeletal: Negative.   Skin: Negative.  Negative for rash.  Neurological: Positive for headaches.  Psychiatric/Behavioral: The patient is nervous/anxious.    Past Medical History  Diagnosis Date  . Hypertension   . Anxiety   . Depression   . Unspecified hypothyroidism   . Insomnia   . HSV (herpes simplex virus) infection   . Allergy   . Hyperlipidemia    Current Outpatient Prescriptions on File Prior to Visit  Medication Sig Dispense Refill  . acyclovir (ZOVIRAX) 200 MG capsule Take 1 capsule (200 mg total) by mouth 2 (two) times daily. 60 capsule 3  . B Complex-Folic Acid (SUPER B COMPLEX MAXI PO) Take by mouth.    . escitalopram (LEXAPRO) 20 MG tablet Take 1 tablet (20 mg total) by mouth daily. 30 tablet 2   . eszopiclone 3 MG TABS take 1 tablet by mouth at bedtime if needed for sleep 30 tablet 2  . levothyroxine (SYNTHROID) 75 MCG tablet Take 1 tablet (75 mcg total) by mouth daily. 90 tablet 1  . levocetirizine (XYZAL) 5 MG tablet Take 1 tablet (5 mg total) by mouth daily as needed. (Patient not taking: Reported on 05/14/2014) 30 tablet 6   No current facility-administered medications on file prior to visit.   Allergies  Allergen Reactions  . Ambien [Zolpidem Tartrate] Other (See Comments)    hallucinations     BP 138/82 mmHg  Pulse 98  Temp(Src) 98.2 F (36.8 C) (Temporal)  Resp 18  Ht 5\' 3"  (1.6 m)  Wt 114 lb (51.71 kg)  BMI 20.20 kg/m2  SpO2 98% Wt Readings from Last 3 Encounters:  05/14/14 114 lb (51.71 kg)  03/08/14 116 lb (52.617 kg)  01/23/14 111 lb (50.349 kg)   Objective:   Physical Exam  Constitutional: She is oriented to person, place, and time. She appears well-developed and well-nourished. She has a sickly appearance. No distress.  HENT:  Head: Normocephalic.  Right Ear: Tympanic membrane, external ear and ear canal normal.  Left Ear: Tympanic membrane, external ear and ear canal normal.  Nose: Nose normal. Right sinus exhibits no maxillary sinus tenderness and no frontal sinus tenderness. Left sinus exhibits no maxillary sinus tenderness and no frontal sinus tenderness.  Mouth/Throat: Uvula is midline, oropharynx is clear and moist  and mucous membranes are normal. No trismus in the jaw. No uvula swelling. No oropharyngeal exudate, posterior oropharyngeal edema, posterior oropharyngeal erythema or tonsillar abscesses.  Black strip noted on tongue.  Patient states it is from Pepto bismol.  Eyes: Conjunctivae and lids are normal. Pupils are equal, round, and reactive to light. Right eye exhibits no discharge. Left eye exhibits no discharge. No scleral icterus.  Neck: Trachea normal, normal range of motion and phonation normal. Neck supple. No tracheal deviation present.   Cardiovascular: Normal rate, regular rhythm, S1 normal, S2 normal, normal heart sounds and normal pulses.  Exam reveals no gallop, no distant heart sounds and no friction rub.   No murmur heard. Pulmonary/Chest: Effort normal and breath sounds normal. No stridor. No respiratory distress. She has no decreased breath sounds. She has no wheezes. She has no rhonchi. She has no rales. She exhibits no tenderness.  Abdominal: Soft. Bowel sounds are normal. She exhibits no distension, no pulsatile liver, no abdominal bruit and no mass. There is no hepatosplenomegaly. There is tenderness in the epigastric area. There is no rebound, no guarding, no CVA tenderness and negative Murphy's sign. No hernia.  Lymphadenopathy:  No tenderness or LAD.  Neurological: She is alert and oriented to person, place, and time. Gait normal.  Skin: Skin is warm, dry and intact. No rash noted. She is not diaphoretic.  Psychiatric: She has a normal mood and affect. Her speech is normal and behavior is normal. Judgment and thought content normal. Cognition and memory are normal.  Vitals reviewed.  Assessment & Plan:  1. Nausea without vomiting- gastroenteritis or anxiety? -Take Zofran as prescribed for nausea- ondansetron (ZOFRAN ODT) 8 MG disintegrating tablet; Take 1 tablet (8 mg total) by mouth every 8 (eight) hours as needed for nausea or vomiting.  Dispense: 20 tablet; Refill: 0 -Take Prilosec as prescribed to help reduce acid- omeprazole (PRILOSEC) 10 MG capsule; Take 1 capsule (10 mg total) by mouth daily.  Dispense: 30 capsule; Refill: 0  2. Anxiety state and Depression -Take Xanax as prescribed for anxiety and sleep - ALPRAZolam (XANAX) 0.5 MG tablet; Take 1/2 to 1 tablet PO at bedtime for sleep.  Dispense: 30 tablet; Refill: 0 Continue Eszopiclone and Lexapro as prescribed  Discussed medication effects and SE's.  Pt agreed to treatment plan. If you are not feeling better in 7-10 days, then please call the  office. Please keep your physical appt on 06/04/14.    Troy Kanouse, Lise AuerJennifer L, PA-C 7:54 PM [facility]

## 2014-05-17 ENCOUNTER — Other Ambulatory Visit: Payer: Self-pay | Admitting: Internal Medicine

## 2014-05-17 DIAGNOSIS — G4700 Insomnia, unspecified: Secondary | ICD-10-CM

## 2014-05-17 MED ORDER — ESZOPICLONE 3 MG PO TABS: ORAL_TABLET | ORAL | Status: DC

## 2014-06-02 ENCOUNTER — Other Ambulatory Visit: Payer: Self-pay | Admitting: Physician Assistant

## 2014-06-04 ENCOUNTER — Encounter: Payer: Self-pay | Admitting: Physician Assistant

## 2014-06-07 ENCOUNTER — Telehealth: Payer: Self-pay | Admitting: Internal Medicine

## 2014-06-07 NOTE — Telephone Encounter (Signed)
Called to resch CPE with MSmith from 05-2014. 2nd message

## 2014-06-22 ENCOUNTER — Encounter: Payer: Self-pay | Admitting: Emergency Medicine

## 2014-06-27 ENCOUNTER — Ambulatory Visit (INDEPENDENT_AMBULATORY_CARE_PROVIDER_SITE_OTHER): Payer: BLUE CROSS/BLUE SHIELD | Admitting: Physician Assistant

## 2014-06-27 ENCOUNTER — Other Ambulatory Visit: Payer: Self-pay | Admitting: Physician Assistant

## 2014-06-27 ENCOUNTER — Encounter: Payer: Self-pay | Admitting: Physician Assistant

## 2014-06-27 VITALS — BP 150/98 | HR 78 | Temp 98.0°F | Resp 18 | Ht 63.0 in | Wt 116.0 lb

## 2014-06-27 DIAGNOSIS — Z9114 Patient's other noncompliance with medication regimen: Secondary | ICD-10-CM

## 2014-06-27 DIAGNOSIS — F32A Depression, unspecified: Secondary | ICD-10-CM

## 2014-06-27 DIAGNOSIS — E871 Hypo-osmolality and hyponatremia: Secondary | ICD-10-CM

## 2014-06-27 DIAGNOSIS — F419 Anxiety disorder, unspecified: Secondary | ICD-10-CM

## 2014-06-27 DIAGNOSIS — G4700 Insomnia, unspecified: Secondary | ICD-10-CM

## 2014-06-27 DIAGNOSIS — E785 Hyperlipidemia, unspecified: Secondary | ICD-10-CM

## 2014-06-27 DIAGNOSIS — E538 Deficiency of other specified B group vitamins: Secondary | ICD-10-CM

## 2014-06-27 DIAGNOSIS — F329 Major depressive disorder, single episode, unspecified: Secondary | ICD-10-CM

## 2014-06-27 DIAGNOSIS — E039 Hypothyroidism, unspecified: Secondary | ICD-10-CM

## 2014-06-27 DIAGNOSIS — Z79899 Other long term (current) drug therapy: Secondary | ICD-10-CM | POA: Insufficient documentation

## 2014-06-27 LAB — HEPATIC FUNCTION PANEL
ALT: 21 U/L (ref 0–35)
AST: 41 U/L — ABNORMAL HIGH (ref 0–37)
Albumin: 5 g/dL (ref 3.5–5.2)
Alkaline Phosphatase: 64 U/L (ref 39–117)
BILIRUBIN INDIRECT: 0.5 mg/dL (ref 0.2–1.2)
BILIRUBIN TOTAL: 0.6 mg/dL (ref 0.2–1.2)
Bilirubin, Direct: 0.1 mg/dL (ref 0.0–0.3)
TOTAL PROTEIN: 8.3 g/dL (ref 6.0–8.3)

## 2014-06-27 LAB — LIPID PANEL
Cholesterol: 266 mg/dL — ABNORMAL HIGH (ref 0–200)
HDL: 111 mg/dL (ref >39)
LDL CALC: 144 mg/dL — AB (ref 0–99)
Total CHOL/HDL Ratio: 2.4 Ratio
Triglycerides: 54 mg/dL (ref <150)
VLDL: 11 mg/dL (ref 0–40)

## 2014-06-27 LAB — BASIC METABOLIC PANEL WITH GFR
BUN: 8 mg/dL (ref 6–23)
CO2: 25 meq/L (ref 19–32)
CREATININE: 0.57 mg/dL (ref 0.50–1.10)
Calcium: 10.4 mg/dL (ref 8.4–10.5)
Chloride: 99 mEq/L (ref 96–112)
GFR, Est African American: >89 mL/min
Glucose, Bld: 99 mg/dL (ref 70–99)
POTASSIUM: 4 meq/L (ref 3.5–5.3)
Sodium: 137 mEq/L (ref 135–145)

## 2014-06-27 MED ORDER — ALPRAZOLAM 0.5 MG PO TABS: ORAL_TABLET | ORAL | Status: DC

## 2014-06-27 MED ORDER — ESCITALOPRAM OXALATE 20 MG PO TABS: 20.0000 mg | ORAL_TABLET | Freq: Every day | ORAL | Status: DC

## 2014-06-27 NOTE — Patient Instructions (Addendum)
-  Take Lexapro every night at bedtime -Take Xanax- 1/4 to 1/2 tablet as needed for anxiety/panic attacks until lexapro kicks in.  -Continue Synthroid as prescribed.  Talk to GYN about recent labs and about estrogen replacement.   Recommend Aspirin 81mg  daily for clot prevention if put on estrogen replacement.  Also look for vaginal bleeding.    Please follow up in 3 months for physical.   Hypothyroidism The thyroid is a large gland located in the lower front of your neck. The thyroid gland helps control metabolism. Metabolism is how your body handles food. It controls metabolism with the hormone thyroxine. When this gland is underactive (hypothyroid), it produces too little hormone.  CAUSES These include:   Absence or destruction of thyroid tissue.  Goiter due to iodine deficiency.  Goiter due to medications.  Congenital defects (since birth).  Problems with the pituitary. This causes a lack of TSH (thyroid stimulating hormone). This hormone tells the thyroid to turn out more hormone. SYMPTOMS  Lethargy (feeling as though you have no energy)  Cold intolerance  Weight gain (in spite of normal food intake)  Dry skin  Coarse hair  Menstrual irregularity (if severe, may lead to infertility)  Slowing of thought processes Cardiac problems are also caused by insufficient amounts of thyroid hormone. Hypothyroidism in the newborn is cretinism, and is an extreme form. It is important that this form be treated adequately and immediately or it will lead rapidly to retarded physical and mental development. DIAGNOSIS  To prove hypothyroidism, your caregiver may do blood tests and ultrasound tests. Sometimes the signs are hidden. It may be necessary for your caregiver to watch this illness with blood tests either before or after diagnosis and treatment. TREATMENT  Low levels of thyroid hormone are increased by using synthetic thyroid hormone. This is a safe, effective treatment. It  usually takes about four weeks to gain the full effects of the medication. After you have the full effect of the medication, it will generally take another four weeks for problems to leave. Your caregiver may start you on low doses. If you have had heart problems the dose may be gradually increased. It is generally not an emergency to get rapidly to normal. HOME CARE INSTRUCTIONS   Take your medications as your caregiver suggests. Let your caregiver know of any medications you are taking or start taking. Your caregiver will help you with dosage schedules.  As your condition improves, your dosage needs may increase. It will be necessary to have continuing blood tests as suggested by your caregiver.  Report all suspected medication side effects to your caregiver. SEEK MEDICAL CARE IF: Seek medical care if you develop:  Sweating.  Tremulousness (tremors).  Anxiety.  Rapid weight loss.  Heat intolerance.  Emotional swings.  Diarrhea.  Weakness. SEEK IMMEDIATE MEDICAL CARE IF:  You develop chest pain, an irregular heart beat (palpitations), or a rapid heart beat. MAKE SURE YOU:   Understand these instructions.  Will watch your condition.  Will get help right away if you are not doing well or get worse. Document Released: 05/25/2005 Document Revised: 08/17/2011 Document Reviewed: 01/13/2008 [facility] CenterExitCare Patient Information 2015 [place], [place]. This information is not intended to replace advice given to you by your health care provider. Make sure you discuss any questions you have with your health care provider.

## 2014-06-27 NOTE — Progress Notes (Signed)
HPI  A Caucasian 54 y.o.female presents to the office today due to follow up with hypothyroidism, hyperlipidemia, depression and anxiety.  Patient is non-compliant and does not like to take medications.  Patient was last seen on 03/08/14 for follow up with labs.  Patient was seen after that (05/14/14) for possible gastritis, depression and anxiety.  Patient was given paper RX for xanax on 05/14/14.  Patient has seen GYN since last visit and she prescribed Paroxetine (Brisdelle) for her hot flashes and depression.  Patient states she does not like "how it makes her feel".  Patient was told in the past that she needs to see a counselor and I asked her about it at last visit and she stated that she was seeing one.  However, she states today that she needs to see one and that she is going to schedule an appt soon.  Patient is a Financial controller for [employer].   Patient is taking Levothyroxine - 1 tablet daily.  She does take it in the mornings and by itself.  She has post-ablation hypothyroidism due to I-131 for hyperthyroidism in approximately 2000.  She reports hot flashes, fatigue, cold intolerance, skin and hair texture changes (dry texture), depression and anxiety.  She denies fever, chills sweats, weight changes, difficulty swallowing, voice changes, palpitations, CP, SOB, diarrhea, constipation and tremors.  Review of Systems  All other systems reviewed and are negative.  Past Medical History-  Past Medical History  Diagnosis Date  . Hypertension   . Anxiety   . Depression   . Unspecified hypothyroidism   . Insomnia   . HSV (herpes simplex virus) infection   . Allergy   . Hyperlipidemia    Medications-  Current Outpatient Prescriptions on File Prior to Visit  Medication Sig Dispense Refill  . levothyroxine (SYNTHROID) 75 MCG tablet Take 1 tablet (75 mcg total) by mouth daily. 90 tablet 1  . acyclovir (ZOVIRAX) 200 MG capsule Take 1 capsule (200 mg total) by mouth 2 (two) times  daily. (Patient not taking: Reported on 06/27/2014) 60 capsule 3  . Eszopiclone 3 MG TABS take 1 tablet by mouth at bedtime if needed for sleep (Patient not taking: Reported on 06/27/2014) 30 tablet 1   No current facility-administered medications on file prior to visit.   Allergies-  Allergies  Allergen Reactions  . Ambien [Zolpidem Tartrate] Other (See Comments)    hallucinations   Physical Exam BP 150/98 mmHg  Pulse 78  Temp(Src) 98 F (36.7 C) (Temporal)  Resp 18  Ht  (1.6 m)  Wt 116 lb (52.617 kg)  BMI 20.55 kg/m2  SpO2 98% Wt Readings from Last 3 Encounters:  06/27/14 116 lb (52.617 kg)  05/14/14 114 lb (51.71 kg)  03/08/14 116 lb (52.617 kg)  Vitals Reviewed. General Appearance: Well nourished, in no apparent distress and had pleasant demeanor. Eyes:  PERRLA. EOMI. Conjunctiva is pink without edema, erythema or yellowing. No scleral icterus.  Sinuses: No Frontal/maxillary tenderness Ears: No erythema, edema or tenderness on both external ear cartilages and ear canals.  TMs are intact bilaterally with normal light reflexes and without erythema, edema or bulging. Nose: Nose is symmetrical and turbinates are pink and not erythematous, edematous or pale.  No polyps, tenderness or rhinorrhea. Throat: Oral pharynx is pink and moist. No erythema, edema or tenderness in pharynx or posterior pharynx Mucosa is intact and without lesions. Tonsils are at +1 station bilaterally and do not have exudate.    Uvula is midline and  not swollen. Neck: Supple,  JVD,  carotid bruits,  LAD,  thyromegaly or thyroid masses. Trachea is midline.  Full range of motion in neck intact Respiratory: CTAB,  r/r/w or stridor. No increased effort of breathing. Cardio: RRR.   m/r/g.  S1S2nl.   Abdomen: Symmetrical, soft, nontender, and flat.  +BS nl x4.  No bruits heard in aorta.   No masses, hernias or pulsatile masses.  Rebound.  Guarding. Extremities:  C/C/E in upper and lower extremities.  Pulses B/L +2  Skin: Warm, dry, intact without rashes, lesions, ecchymosis, yellowing, cyanosis.  Neuro: Alert and oriented X3, cooperative.  Mood and affect appropriate to situation.  CN II-XII grossly intact.  Psych: Insight and Judgment appropriate.   Assessment and Plan 1. Depression and Anxiety and Insomnia Stop taking the Paroxetine/Brisdelle. -Start taking the Lexapro again- escitalopram (LEXAPRO) 20 MG tablet; Take 1 tablet (20 mg total) by mouth daily.  Dispense: 30 tablet; Refill: 2 -Start taking Xanax 1/4-1/2 tablet as needed (max-1 tablet daily).  Will only give enough for 4-6 weeks- ALPRAZolam (XANAX) 0.5 MG tablet; Take 1/2 to 1 tablet PO at bedtime for sleep.  Dispense: 30 tablet; Refill: 0 -Discuss with GYN about Estrogen Replacement.  If estrogen replacement is prescribed by GYN, then recommend Aspirin 81mg  daily for prevention of clots. -Recommend that patient see counselor.  If patient keeps being non-compliant with medications, then she needs to see [facility] and we will no longer prescribe medications for depression and anxiety. -Please monitor BP at home, and please let me know if BP is above 140/90.  2. Hypothyroidism, unspecified hypothyroidism type Check TSH level, continue medications the same, reminded to take on an empty stomach 30-6960mins before food.  - TSH  5. Hyperlipidemia Please follow recommended diet and exercise.  Check Cholesterol level. -Lipid panel  4. Encounter for long-term (current) use of medications/Hyponatremia  Will monitor kidney and liver function. - CBC with Differential - BASIC METABOLIC PANEL WITH GFR - Hepatic function panel  Discussed medication effects and SE's.  Pt agreed to treatment plan. Please follow up in 3 months.  Please call in 1 month and let me know how you are doing on the medications.  Smith Potenza, Lise AuerJennifer L, PA-C 3:26 PM [facility]

## 2014-06-28 LAB — CBC WITH DIFFERENTIAL/PLATELET
Basophils Absolute: 0 10*3/uL (ref 0.0–0.1)
Basophils Relative: 0 % (ref 0–1)
EOS ABS: 0 10*3/uL (ref 0.0–0.7)
EOS PCT: 0 % (ref 0–5)
HEMATOCRIT: 38.9 % (ref 36.0–46.0)
Hemoglobin: 13.1 g/dL (ref 12.0–15.0)
LYMPHS PCT: 28 % (ref 12–46)
Lymphs Abs: 1.3 10*3/uL (ref 0.7–4.0)
MCH: 34.6 pg — AB (ref 26.0–34.0)
MCHC: 33.7 g/dL (ref 30.0–36.0)
MCV: 102.6 fL — ABNORMAL HIGH (ref 78.0–100.0)
MPV: 10.5 fL (ref 8.6–12.4)
Monocytes Absolute: 0.5 10*3/uL (ref 0.1–1.0)
Monocytes Relative: 10 % (ref 3–12)
NEUTROS PCT: 62 % (ref 43–77)
Neutro Abs: 3 10*3/uL (ref 1.7–7.7)
Platelets: 223 10*3/uL (ref 150–400)
RBC: 3.79 MIL/uL — AB (ref 3.87–5.11)
RDW: 13.2 % (ref 11.5–15.5)
WBC: 4.8 10*3/uL (ref 4.0–10.5)

## 2014-06-28 LAB — TSH: TSH: 2.763 u[IU]/mL (ref 0.350–4.500)

## 2014-06-28 LAB — VITAMIN B12: VITAMIN B 12: 250 pg/mL (ref 211–911)

## 2014-07-02 ENCOUNTER — Other Ambulatory Visit: Payer: Self-pay | Admitting: Physician Assistant

## 2014-07-02 ENCOUNTER — Other Ambulatory Visit: Payer: Self-pay | Admitting: *Deleted

## 2014-07-02 MED ORDER — ALPRAZOLAM 0.5 MG PO TABS: ORAL_TABLET | ORAL | Status: DC

## 2014-08-08 ENCOUNTER — Other Ambulatory Visit: Payer: BLUE CROSS/BLUE SHIELD

## 2014-08-08 DIAGNOSIS — I10 Essential (primary) hypertension: Secondary | ICD-10-CM

## 2014-08-08 DIAGNOSIS — R5383 Other fatigue: Secondary | ICD-10-CM

## 2014-08-08 DIAGNOSIS — E039 Hypothyroidism, unspecified: Secondary | ICD-10-CM

## 2014-08-08 DIAGNOSIS — E871 Hypo-osmolality and hyponatremia: Secondary | ICD-10-CM

## 2014-08-08 DIAGNOSIS — E785 Hyperlipidemia, unspecified: Secondary | ICD-10-CM

## 2014-08-08 DIAGNOSIS — E782 Mixed hyperlipidemia: Secondary | ICD-10-CM

## 2014-08-08 DIAGNOSIS — E538 Deficiency of other specified B group vitamins: Secondary | ICD-10-CM

## 2014-08-08 LAB — CBC WITH DIFFERENTIAL/PLATELET
Basophils Absolute: 0.1 10*3/uL (ref 0.0–0.1)
Basophils Relative: 1 % (ref 0–1)
EOS ABS: 0.1 10*3/uL (ref 0.0–0.7)
Eosinophils Relative: 1 % (ref 0–5)
HEMATOCRIT: 36 % (ref 36.0–46.0)
Hemoglobin: 12.1 g/dL (ref 12.0–15.0)
LYMPHS ABS: 1.3 10*3/uL (ref 0.7–4.0)
LYMPHS PCT: 22 % (ref 12–46)
MCH: 34.8 pg — AB (ref 26.0–34.0)
MCHC: 33.6 g/dL (ref 30.0–36.0)
MCV: 103.4 fL — ABNORMAL HIGH (ref 78.0–100.0)
MONOS PCT: 6 % (ref 3–12)
MPV: 10.2 fL (ref 8.6–12.4)
Monocytes Absolute: 0.4 10*3/uL (ref 0.1–1.0)
NEUTROS PCT: 70 % (ref 43–77)
Neutro Abs: 4.1 10*3/uL (ref 1.7–7.7)
Platelets: 182 10*3/uL (ref 150–400)
RBC: 3.48 MIL/uL — AB (ref 3.87–5.11)
RDW: 13.7 % (ref 11.5–15.5)
WBC: 5.9 10*3/uL (ref 4.0–10.5)

## 2014-08-08 LAB — TSH: TSH: 2.557 u[IU]/mL (ref 0.350–4.500)

## 2014-08-08 LAB — BASIC METABOLIC PANEL WITH GFR
BUN: 6 mg/dL (ref 6–23)
CO2: 22 meq/L (ref 19–32)
Calcium: 9.6 mg/dL (ref 8.4–10.5)
Chloride: 102 mEq/L (ref 96–112)
Creat: 0.58 mg/dL (ref 0.50–1.10)
GFR, Est African American: >89 mL/min
Glucose, Bld: 75 mg/dL (ref 70–99)
Potassium: 3.9 mEq/L (ref 3.5–5.3)
Sodium: 136 mEq/L (ref 135–145)

## 2014-08-08 LAB — HEPATIC FUNCTION PANEL
ALT: 15 U/L (ref 0–35)
AST: 22 U/L (ref 0–37)
Albumin: 4.6 g/dL (ref 3.5–5.2)
Alkaline Phosphatase: 49 U/L (ref 39–117)
BILIRUBIN INDIRECT: 0.3 mg/dL (ref 0.2–1.2)
BILIRUBIN TOTAL: 0.4 mg/dL (ref 0.2–1.2)
Bilirubin, Direct: 0.1 mg/dL (ref 0.0–0.3)
Total Protein: 7.2 g/dL (ref 6.0–8.3)

## 2014-08-08 LAB — LIPID PANEL
CHOL/HDL RATIO: 2.2 ratio
Cholesterol: 205 mg/dL — ABNORMAL HIGH (ref 0–200)
HDL: 93 mg/dL (ref >=46)
LDL CALC: 104 mg/dL — AB (ref 0–99)
TRIGLYCERIDES: 40 mg/dL (ref <150)
VLDL: 8 mg/dL (ref 0–40)

## 2014-08-08 LAB — VITAMIN B12: VITAMIN B 12: 293 pg/mL (ref 211–911)

## 2014-08-22 ENCOUNTER — Other Ambulatory Visit: Payer: Self-pay | Admitting: Physician Assistant

## 2014-09-11 ENCOUNTER — Encounter: Payer: Self-pay | Admitting: Internal Medicine

## 2014-09-11 ENCOUNTER — Ambulatory Visit (INDEPENDENT_AMBULATORY_CARE_PROVIDER_SITE_OTHER): Payer: BLUE CROSS/BLUE SHIELD | Admitting: Internal Medicine

## 2014-09-11 VITALS — BP 124/80 | HR 82 | Temp 98.2°F | Resp 18 | Ht 63.0 in | Wt 120.0 lb

## 2014-09-11 DIAGNOSIS — F419 Anxiety disorder, unspecified: Secondary | ICD-10-CM

## 2014-09-11 DIAGNOSIS — F32A Depression, unspecified: Secondary | ICD-10-CM

## 2014-09-11 DIAGNOSIS — F329 Major depressive disorder, single episode, unspecified: Secondary | ICD-10-CM

## 2014-09-11 DIAGNOSIS — G4700 Insomnia, unspecified: Secondary | ICD-10-CM

## 2014-09-11 DIAGNOSIS — E0789 Other specified disorders of thyroid: Secondary | ICD-10-CM

## 2014-09-11 MED ORDER — ALPRAZOLAM 0.5 MG PO TABS: 0.5000 mg | ORAL_TABLET | Freq: Three times a day (TID) | ORAL | Status: DC | PRN

## 2014-09-11 NOTE — Progress Notes (Signed)
Subjective:    Patient ID: Stacy [name], female    DOB: 01-31-1961, 54 y.o.   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  Patient reports that she has been having a lot of hot flashes.  She reports that she is having a really difficult time with her medications.  She does not want to take lexapro or xanax at this time.  She is not currently going to therapy at this time.  She reports that the lexapro is not doing any good.  She has weaned herself off of the xanax and is unsure what to do at this point.  Patient has been very non-compliant with medications here in the past per chart review.  Patient has been on paroxitine, lexapro, and zoloft.  Patient also concerned that her thyroid is not working properly.  She had a I131 abblation many years ago.  She is concerned that her goiter is coming back and would like to have thyroid level rechecked.    Review of Systems  Constitutional: Positive for fatigue. Negative for fever and chills.  Respiratory: Negative for chest tightness and shortness of breath.   Cardiovascular: Negative for chest pain, palpitations and leg swelling.  Gastrointestinal: Negative for nausea, vomiting, abdominal pain, diarrhea, constipation and blood in stool.  Endocrine: Negative for cold intolerance and heat intolerance.  Genitourinary: Negative for dysuria, urgency, frequency, hematuria and difficulty urinating.  Neurological: Negative for dizziness and tremors.  Psychiatric/Behavioral: Positive for sleep disturbance, dysphoric mood and decreased concentration. Negative for behavioral problems. The patient is nervous/anxious.        Objective:   Physical Exam  Constitutional: She is oriented to person, place, and time. She appears well-developed and well-nourished. No distress.  HENT:  Head: Normocephalic and atraumatic.  Mouth/Throat: No oropharyngeal exudate.  Eyes:  Conjunctivae and EOM are normal. Pupils are equal, round, and reactive to light. No scleral icterus.  Neck: Normal range of motion. Neck supple. No JVD present. No thyromegaly present.  Cardiovascular: Normal rate, regular rhythm, normal heart sounds and intact distal pulses.  Exam reveals no gallop and no friction rub.   No murmur heard. Pulmonary/Chest: Effort normal and breath sounds normal. No respiratory distress. She has no wheezes. She has no rales. She exhibits no tenderness.  Abdominal: Soft. Bowel sounds are normal. She exhibits no distension and no mass. There is no tenderness. There is no rebound and no guarding.  Musculoskeletal: Normal range of motion.  Lymphadenopathy:    She has no cervical adenopathy.  Neurological: She is alert and oriented to person, place, and time.  Skin: Skin is warm and dry. She is not diaphoretic.  Psychiatric: Her speech is normal and behavior is normal. Judgment and thought content normal. Her mood appears anxious. Cognition and memory are normal. She expresses no homicidal and no suicidal ideation. She expresses no suicidal plans and no homicidal plans.  Nursing note and vitals reviewed.         Assessment & Plan:    1. Depression -cont lexapro until she sees counseler -patient non-compliant in past.  Multiple meds tried will defer further psych meds to psychiatrist -given information for the [facility] and also the [facility] -encouraged therapy  2. Anxiety -encouraged therapy -relaxation techniques -information for psychiatrist given  3. Insomnia -cont lunesta -xanax prn for sleep, patient told I will not refill medication early  Patient  educated on her thyroid and was told that this did not need to be rechecked.  Patient to follow-up in 2 months for thyroid recheck and to determine whether she needs to have thyroid medications adjusted.   Over 40 minutes of exam, counseling, chart review and critical  decision making was performed

## 2014-09-11 NOTE — Patient Instructions (Signed)
Recommend that you see [facility].  Address: [address]     [place], [place] [address] Phone-[phone]  Psychological Services:  [facility] [phone]  [facility] [phone]  [facility] [place], [place], ACCESS LINE: [phone] or [phone], [email or web address]  Please Look into counseling and seeing a psychiatrist.  Unfortunately we cannot refer you to one so you may need to do a little investigation to find the right psychiatrist for you.  The information above is a good place to start.  Please take 1/2-1 xanax to help you sleep.  Let us know if you need any assistance.

## 2014-09-26 ENCOUNTER — Ambulatory Visit (INDEPENDENT_AMBULATORY_CARE_PROVIDER_SITE_OTHER): Payer: BLUE CROSS/BLUE SHIELD | Admitting: Physician Assistant

## 2014-09-26 ENCOUNTER — Ambulatory Visit: Payer: Self-pay | Admitting: Physician Assistant

## 2014-09-26 ENCOUNTER — Encounter: Payer: Self-pay | Admitting: Physician Assistant

## 2014-09-26 VITALS — BP 130/82 | HR 88 | Temp 98.1°F | Resp 16 | Ht 63.0 in | Wt 117.0 lb

## 2014-09-26 DIAGNOSIS — E039 Hypothyroidism, unspecified: Secondary | ICD-10-CM

## 2014-09-26 DIAGNOSIS — M79672 Pain in left foot: Secondary | ICD-10-CM

## 2014-09-26 DIAGNOSIS — G4700 Insomnia, unspecified: Secondary | ICD-10-CM

## 2014-09-26 MED ORDER — ACYCLOVIR 200 MG PO CAPS: 200.0000 mg | ORAL_CAPSULE | Freq: Two times a day (BID) | ORAL | Status: DC

## 2014-09-26 MED ORDER — ESTRADIOL-NORETHINDRONE ACET 1-0.5 MG PO TABS: 1.0000 | ORAL_TABLET | Freq: Every day | ORAL | Status: DC

## 2014-09-26 NOTE — Patient Instructions (Addendum)
Add ENTERIC COATED low dose 81 mg Aspirin daily OR can do every other day if you have easy bruising to protect your heart and head.   Stop the estrogen patch and progesterone pill and start on the once a day pill    Metatarsal Fracture  with Rehab A metatarsal fracture is a break (fracture) of one of the bones of the mid-foot (metatarsal bones). The metatarsal bones are responsible for maintaining the arch of the foot. There are three classifications of metatarsal fractures: dancer's fractures, Jones fractures, and stress fractures. A dancer's fracture is when a piece of bone is pulled off by a ligament or tendon (avulsion fracture) of the outer part of the foot (fifth metatarsal), near the joint with the ankle bones. A Jones fracture occurs in the middle of the fifth metatarsal. These fractures have limited ability to heal. A stress fracture occurs when the bone is slowly injured faster than it can repair itself. SYMPTOMS   Sharp pain, especially with standing or walking.  Tenderness, swelling, and later bruising (contusion) of the foot.  Numbness or paralysis from swelling in the foot, causing pressure on the blood vessels or nerves (uncommon). CAUSES  Fractures occur when a force is placed on the bone that is greater than it can handle. Common causes of injury include:  Direct hit (trauma) to the foot.  Twisting injury to the foot or ankle.  Landing on the foot and ankle in an improper position. RISK INCRESES WITH:  Participation in contact sports, sports that require jumping and landing, or sports in which cleats are worn and sliding occurs.  Previous foot or ankle sprains or dislocations.  Repeated injury to any joint in the foot.  Poor strength and flexibility. PREVENTION  Warm up and stretch properly before an activity.  Allow for adequate recovery between workouts.  Maintain physical fitness in:  Strength, flexibility, and endurance.  Cardiovascular fitness.  When  participating in jumping or contact sports, protect joints with supportive devices, such as wrapped elastic bandages, tape, braces, or high-top athletic shoes.  Wear properly fitted and padded protective equipment. PROGNOSIS If treated properly, metatarsal fractures usually heal well. Jones fractures have a higher risk of the bone failing to heal (nonunion). Sometimes, surgery is needed to heal Jones fractures.  RELATED COMPLICATIONS   Nonunion.  Fracture heals in a poor position (malunion).  Long-term (chronic) pain, stiffness, or swelling of the foot.  Excessive bleeding in the foot or at the dislocation site, causing pressure and injury to nerves and blood vessels (rare).  Unstable or arthritic joint following repeated injury or delayed treatment. TREATMENT  Treatment first involves the use of ice and medicine, to reduce pain and inflammation. If the bone fragments are out of alignment (displaced), then immediate realigning of the bones (reduction) is required. Fractures that cannot be realigned by hand, or where the bones protrude through the skin (open), may require surgery to hold the fracture in place with screws, pins, and plates. After the bones are in proper alignment, the foot and ankle must be restrained for 6 or more weeks. Restraint allows healing to occur. After restraint, it is important to perform strengthening and stretching exercises to help regain strength and a full range of motion. These exercises may be completed at home or with a therapist. A stiff-soled shoe and arch support (orthotic) may be required when first returning to sports. MEDICATION   If pain medicine is needed, nonsteroidal anti-inflammatory medicines (NSAIDS), or other minor pain relievers, are often  advised.  Do not take pain medicine for 7 days before surgery.  Only take over-the-counter or prescription medicines for pain, fever, or discomfort as directed by your caregiver. COLD THERAPY  Cold  treatment (icing) should be applied for 10 to 15 minutes every 2 to 3 hours for inflammations and pain, and immediately after activity that aggravates your symptoms. Use ice packs or ice massage. SEEK MEDICAL CARE IF:  Pain, tenderness, or swelling gets worse, despite treatment.  You experience pain, numbness, or coldness in the foot.  Blue, gray, or dark color appears in the toenails.  You or your child has an oral temperature above 102 F (38.9 C).  You have increased pain, swelling, and redness.  You have drainage of fluids or bleeding in the affected area.  New, unexplained symptoms develop. (Drugs used in treatment may produce side effects.) EXERCISES RANGE OF MOTION (ROM) AND STRETCHING EXERCISES - Metatarsal Fracture (including Jones and Dancer's Fractures) These exercises may help you when beginning to rehabilitate your injury. Your symptoms may resolve with or without further involvement from your physician, physical therapist, or athletic trainer. While completing these exercises, remember:   Restoring tissue flexibility helps normal motion to return to the joints. This allows healthier, less painful movement and activity.  An effective stretch should be held for at least 30 seconds. A stretch should never be painful. You should only feel a gentle lengthening or release in the stretched. RANGE OF MOTION - Dorsi/Plantar Flexion  While sitting with your right / left knee straight, draw the top of your foot upwards, by flexing your ankle. Then reverse the motion, pointing your toes downward.  Hold each position for __________ seconds.  After completing your first set of exercises, repeat this exercise with your knee bent. Repeat __________ times. Complete this exercise __________ times per day.  RANGE OF MOTION - Ankle Alphabet  Imagine your right / left big toe is a pen.  Keeping your hip and knee still, write out the entire alphabet with your "pen." Make the letters as  large as you can, without increasing any discomfort. Repeat __________ times. Complete this exercise __________ times per day.  STRETCH - Gastroc, Standing  Place your hands on a wall.  Extend your right / left leg behind you, keeping the front knee somewhat bent.  Slightly point your toes inward on your back foot.  Keeping your right / left heel on the floor and your knee straight, shift your weight toward the wall, not allowing your back to arch.  You should feel a gentle stretch in the right / left calf. Hold this position for __________ seconds. Repeat __________ times. Complete this stretch __________ times per day. STRETCH - Soleus, Standing   Place your hands on a wall.  Extend your right / left leg behind you, keeping the other knee somewhat bent.  Slightly point your toes inward on your back foot.  Keep your right / left heel on the floor, bend your back knee, and slightly shift your weight over the back leg so that you feel a gentle stretch deep in your back calf.  Hold this position for __________ seconds. Repeat __________ times. Complete this stretch __________ times per day. STRENGTHENING EXERCISES - Metatarsal Fracture (Including Jones and Dancer's Fractures) These exercises may help you when beginning to rehabilitate your injury. They may resolve your symptoms with or without further involvement from your physician, physical therapist, or athletic trainer. While completing these exercises, remember:   Muscles can gain  both the endurance and the strength needed for everyday activities through controlled exercises.  Complete these exercises as instructed by your physician, physical therapist or athletic trainer. Increase the resistance and repetitions only as guided by your caregiver. STRENGTH - Dorsiflexors  Secure a rubber exercise band or tubing to a fixed object (table, pole) and loop the other end around your right / left foot.  Sit on the floor facing the fixed  object. The band should be slightly tense when your foot is relaxed.  Slowly draw your foot back toward you, using your ankle and toes.  Hold this position for __________ seconds. Slowly release the tension in the band and return your foot to the starting position. Repeat __________ times. Complete this exercise __________ times per day.  STRENGTH - Plantar-flexors   Sit with your right / left leg extended. Holding onto both ends of a rubber exercise band or tubing, loop it around the ball of your foot. Keep a slight tension in the band.  Slowly push your toes away from you, pointing them downward.  Hold this position for __________ seconds. Return slowly, controlling the tension in the band. Repeat __________ times. Complete this exercise __________ times per day.  STRENGTH - Plantar-flexors  Stand with your feet shoulder width apart. Steady yourself with a wall or table, using as little support as needed.  Keeping your weight evenly spread over the width of your feet, rise up on your toes.*  Hold this position for __________ seconds. Repeat __________ times. Complete this exercise __________ times per day.  *If this is too easy, shift your weight toward your right / left leg until you feel challenged. Ultimately, you may be asked to do this exercise while standing on your right / left foot only. STRENGTH - Towel Curls  Sit in a chair, on a non-carpeted surface.  Place your foot on a towel, keeping your heel on the floor.  Pull the towel toward your heel only by curling your toes. Keep your heel on the floor.  If instructed by your physician, physical therapist, or athletic trainer, weight may be added at the end of the towel. Repeat __________ times. Complete this exercise __________ times per day. STRENGTH - Ankle Eversion  Secure one end of a rubber exercise band or tubing to a fixed object (table, pole). Loop the other end around your foot, just before your toes.  Place your  fists between your knees. This will focus your strengthening at your ankle.  Drawing the band across your opposite foot, away from the pole, slowly, pull your little toe out and up. Make sure the band is positioned to resist the entire motion.  Hold this position for __________ seconds.  Have your muscles resist the band, as it slowly pulls your foot back to the starting position. Repeat __________ times. Complete this exercise __________ times per day.  STRENGTH - Ankle Inversion  Secure one end of a rubber exercise band or tubing to a fixed object (table, pole). Loop the other end around your foot, just before your toes.  Place your fists between your knees. This will focus your strengthening at your ankle.  Slowly, pull your big toe up and in, making sure the band is positioned to resist the entire motion.  Hold this position for __________ seconds.  Have your muscles resist the band, as it slowly pulls your foot back to the starting position. Repeat __________ times. Complete this exercises __________ times per day.  Document Released: 05/25/2005  Document Revised: 08/17/2011 Document Reviewed: 09/07/2013 [facility] Patient Information 2015 [employer] [place]. This information is not intended to replace advice given to you by your health care provider. Make sure you discuss any questions you have with your health care provider.

## 2014-09-26 NOTE — Progress Notes (Signed)
SUBJECTIVE: Stacy [name] is a 54 y.o. female who complains of inversion injury to the left ankle 3 day(s) ago. Immediate symptoms: immediate pain, immediate swelling, inability to bear weight directly after injury for a day, but she is able to walk on it now with some pain, no deformity was noted by the patient. Symptoms have been intermittent since that time, better since she has had 3 days off of work and has been able to rest. 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  Prior history of related problems: no prior problems with this area in the past. There is pain and swelling at the lateral aspect of that ankle.   In addition she states that she has been to a counselor in the past and she is having a lot going on right now and would like to see someone. She has an appointment to see Rich Brave, PhD tomorrow. I have encouraged her to keep this appointment. She has a lot of issue going from [place] to [place], 3 days on and 3 days off. She gets very messed up with her meds, she had her lunesta filled on 04/11 and does not have anymore left. patient has patch and progesterone pills, she states she is unable to remember to take progesterone,and the patch is like a "wound" every time she sees it and wants to change.   She is very afraid that she has forgotten her medications or have gotten out of sync, would like her thyroid checked. She is on levothyroxine daily.  Lab Results  Component Value Date   TSH 2.557 08/08/2014   Current Outpatient Prescriptions on File Prior to Visit  Medication Sig Dispense Refill  . acyclovir (ZOVIRAX) 200 MG capsule Take 1 capsule (200 mg total) by mouth 2 (two) times daily. 60 capsule 3  . ALPRAZolam (XANAX) 0.5 MG tablet Take 1 tablet (0.5 mg total) by mouth 3 (three) times daily as needed for sleep or anxiety. 60 tablet 0  . escitalopram (LEXAPRO) 20 MG tablet Take 1 tablet (20 mg total) by mouth daily. 30 tablet 2  .  Eszopiclone 3 MG TABS take 1 tablet by mouth at bedtime if needed for sleep 30 tablet 1  . levothyroxine (SYNTHROID, LEVOTHROID) 75 MCG tablet take 1 tablet by mouth once daily 90 tablet 1   No current facility-administered medications on file prior to visit.   Past Medical History  Diagnosis Date  . Hypertension   . Anxiety   . Depression   . Unspecified hypothyroidism   . Insomnia   . HSV (herpes simplex virus) infection   . Allergy   . Hyperlipidemia    OBJECTIVE: She appears well, vital signs are normal. She has pressured speech, is crying, denies SI/HI. Cardio: RRR,without MRG. Lung: CTAB Abdomen: non tender Musculoskeletal; There is swelling and tenderness over the lateral malleolus but a lot of eccymosis around the lateral foot. No tenderness over the medial aspect of the ankle. The fifth metatarsal is tender to palpation. The ankle joint is intact without excessive opening on stressing. X-ray: not available Good ROM, good sensation distal, good TP/DP.   ASSESSMENT AND PLAN: Ankle  sprain and rule out Jones fracture/metatarsal fracture rest the injured area as much as practical, apply ice packs, elevate the injured limb, compressive bandage, X-Ray ordered See orders for this visit as documented in the electronic medical record.  Anxiety- follow up with psych, continue lexapro.   Insomnia- has taken 30  lunesta in 11 days- will not refill- counseled on compliance- denies SI- will refer to sleep medicine- may possibly need sleep study or different medication/nuvigil- also strong psych component, encouraged to keep appointment with psych.   Post menopausal- will switch to 1 day combo pill of progesterone and estrogen, understands risk of estrogen, will add bASA, get MGM, has follow up with Dr. Algie CofferFogelman in 1 month.   Hypothyroidism-check TSH level, continue medications the same, reminded to take on an empty stomach 30-6160mins before food.   Over 30 minutes of exam, counseling, chart  review and critical decision making was performed

## 2014-09-27 LAB — TSH: TSH: 2.605 u[IU]/mL (ref 0.350–4.500)

## 2014-10-04 ENCOUNTER — Telehealth: Payer: Self-pay | Admitting: Internal Medicine

## 2014-10-04 ENCOUNTER — Telehealth: Payer: Self-pay

## 2014-10-04 NOTE — Telephone Encounter (Signed)
Received paper note from front office staff, patient called and wanted to have her thyroid re-checked. Note only states that she thinks her thyroid is "out of range" . Per Quentin MullingAmanda Collier, PA, returned call to patient and [facility] [phone], advised her that there is no reason to re-check her thyroid it was checked on 09-26-2014 and it was normal and was advised at that time to continue medication the same. Advised via machine that if she is not feeling well she needs to make a office visit.

## 2014-10-04 NOTE — Telephone Encounter (Signed)
Patient called to schedule office visit to evaluate her thyroid.  I advised patient she is a current patient at [facility]with Dr Romero BellingSean Ellison, and she could call and schedule an office visit with him to evaluate her concern over a possible growth (goiter? and thyroid changing fast.  She said she prefers to see a women, so she will ask if that is possible when she calls to schedule.  Thank you, Katrina Webb [facility] [phone] Fax [phone]

## 2014-10-09 ENCOUNTER — Ambulatory Visit: Payer: Self-pay | Admitting: Internal Medicine

## 2014-10-12 ENCOUNTER — Ambulatory Visit: Payer: Self-pay | Admitting: Endocrinology

## 2014-10-22 ENCOUNTER — Encounter: Payer: Self-pay | Admitting: Physician Assistant

## 2014-10-22 ENCOUNTER — Other Ambulatory Visit: Payer: Self-pay

## 2014-10-22 ENCOUNTER — Ambulatory Visit (INDEPENDENT_AMBULATORY_CARE_PROVIDER_SITE_OTHER): Payer: BLUE CROSS/BLUE SHIELD | Admitting: Physician Assistant

## 2014-10-22 ENCOUNTER — Encounter: Payer: Self-pay | Admitting: Internal Medicine

## 2014-10-22 VITALS — BP 132/80 | HR 80 | Temp 97.7°F | Resp 16 | Ht 63.0 in | Wt 123.0 lb

## 2014-10-22 DIAGNOSIS — S92902A Unspecified fracture of left foot, initial encounter for closed fracture: Secondary | ICD-10-CM

## 2014-10-22 DIAGNOSIS — E039 Hypothyroidism, unspecified: Secondary | ICD-10-CM

## 2014-10-22 LAB — TSH: TSH: 0.606 u[IU]/mL (ref 0.350–4.500)

## 2014-10-22 MED ORDER — ALPRAZOLAM 0.5 MG PO TABS: 0.5000 mg | ORAL_TABLET | Freq: Three times a day (TID) | ORAL | Status: DC | PRN

## 2014-10-22 NOTE — Patient Instructions (Signed)
Future Appointments Date Time Provider Department Center  11/07/2014 2:00 PM Melvyn Novasarmen Dohmeier, MD [facility] None  11/12/2014 4:15 PM Terri Piedraourtney Forcucci, PA-C [facility] None     Food Choices for Gastroesophageal Reflux Disease When you have gastroesophageal reflux disease (GERD), the foods you eat and your eating habits are very important. Choosing the right foods can help ease the discomfort of GERD. WHAT GENERAL GUIDELINES DO I NEED TO FOLLOW?  Choose fruits, vegetables, whole grains, low-fat dairy products, and low-fat meat, fish, and poultry.  Limit fats such as oils, salad dressings, butter, nuts, and avocado.  Keep a food diary to identify foods that cause symptoms.  Avoid foods that cause reflux. These may be different for different people.  Eat frequent small meals instead of three large meals each day.  Eat your meals slowly, in a relaxed setting.  Limit fried foods.  Cook foods using methods other than frying.  Avoid drinking alcohol.  Avoid drinking large amounts of liquids with your meals.  Avoid bending over or lying down until 2-3 hours after eating. WHAT FOODS ARE NOT RECOMMENDED? The following are some foods and drinks that may worsen your symptoms: Vegetables Tomatoes. Tomato juice. Tomato and spaghetti sauce. Chili peppers. Onion and garlic. Horseradish. Fruits Oranges, grapefruit, and lemon (fruit and juice). Meats High-fat meats, fish, and poultry. This includes hot dogs, ribs, ham, sausage, salami, and bacon. Dairy Whole milk and chocolate milk. Sour cream. Cream. Butter. Ice cream. Cream cheese.  Beverages Coffee and tea, with or without caffeine. Carbonated beverages or energy drinks. Condiments Hot sauce. Barbecue sauce.  Sweets/Desserts Chocolate and cocoa. Donuts. Peppermint and spearmint. Fats and Oils High-fat foods, including [place] fries and potato chips. Other Vinegar. Strong spices, such as black pepper, white pepper, red pepper, cayenne,  curry powder, cloves, ginger, and chili powder. The items listed above may not be a complete list of foods and beverages to avoid. Contact your dietitian for more information. Document Released: 05/25/2005 Document Revised: 05/30/2013 Document Reviewed: 03/29/2013 [facility] Patient Information 2015 [employer] [place]. This information is not intended to replace advice given to you by your health care provider. Make sure you discuss any questions you have with your health care provider.

## 2014-10-22 NOTE — Progress Notes (Signed)
Subjective:    Patient ID: Stacy [name], female    DOB: 02/07/1961, 54 y.o.   MRN: [id number]  HPI 54 y.o.  WF with history of inversion of left ankle on 09/23/2014 continues to have foot pain. She was suppose to get an xray to rule out 5th metatarsal fracture but she never went, she went to [facility] to [facility] on Saturday the 23rd, [facility]. She continues to have pain, she is wearing the boot from [facility]. Feels tender left 5th metatarsal and pain with bearing complete weight on left foot with some radiation to bottom of foot without numbness and tingling. She has been on meloxicam since her foot and has had some nausea/burping/GERD.   In addition she saw Dr. Algie CofferFogelman and her thyroid was abnormal, started on 88mcg once daily. Will recheck thyroid today.   Current Outpatient Prescriptions on File Prior to Visit  Medication Sig Dispense Refill  . acyclovir (ZOVIRAX) 200 MG capsule Take 1 capsule (200 mg total) by mouth 2 (two) times daily. 60 capsule 3  . escitalopram (LEXAPRO) 20 MG tablet Take 1 tablet (20 mg total) by mouth daily. 30 tablet 2  . levothyroxine (SYNTHROID, LEVOTHROID) 75 MCG tablet take 1 tablet by mouth once daily 90 tablet 1  . Eszopiclone 3 MG TABS take 1 tablet by mouth at bedtime if needed for sleep 30 tablet 1   No current facility-administered medications on file prior to visit.    Past Medical History  Diagnosis Date  . Hypertension   . Anxiety   . Depression   . Unspecified hypothyroidism   . Insomnia   . HSV (herpes simplex virus) infection   . Allergy   . Hyperlipidemia     Review of Systems  Constitutional: Positive for fatigue. Negative for fever and chills.  HENT: Negative.   Respiratory: Negative for chest tightness and shortness of breath.   Cardiovascular: Negative for chest pain, palpitations and leg swelling.  Gastrointestinal: Negative for nausea, vomiting, abdominal pain, diarrhea, constipation and  blood in stool.  Endocrine: Negative for cold intolerance and heat intolerance.  Genitourinary: Negative for dysuria, urgency, frequency, hematuria and difficulty urinating.  Musculoskeletal: Positive for myalgias, arthralgias and gait problem. Negative for back pain, joint swelling, neck pain and neck stiffness.  Neurological: Negative for dizziness and tremors.  Psychiatric/Behavioral: Positive for sleep disturbance, dysphoric mood and decreased concentration. Negative for behavioral problems. The patient is nervous/anxious.        Objective:   Physical Exam  Constitutional: She is oriented to person, place, and time. She appears well-developed and well-nourished. No distress.  HENT:  Head: Normocephalic and atraumatic.  Mouth/Throat: No oropharyngeal exudate.  Eyes: No scleral icterus.  Neck: Normal range of motion. Neck supple. No thyromegaly present.  Cardiovascular: Normal rate, regular rhythm and intact distal pulses.   No murmur heard. Pulmonary/Chest: Effort normal and breath sounds normal.  Abdominal: Soft. Bowel sounds are normal. She exhibits no distension and no mass. There is tenderness (epigastric). There is no rebound and no guarding.  Musculoskeletal: Normal range of motion.  No swelling, ecchymosis.  No tenderness over the medial aspect of the ankle. The fifth metatarsal is tender to palpation. The ankle joint is intact without excessive opening on stressing. Good ROM, good sensation distal, good TP/DP.  Lymphadenopathy:    She has no cervical adenopathy.  Neurological: She is alert and oriented to person, place, and time.  Skin: Skin  is warm and dry. She is not diaphoretic.  Psychiatric: Her speech is normal and behavior is normal. Judgment and thought content normal. Her mood appears anxious. Cognition and memory are normal. She expresses no homicidal and no suicidal ideation. She expresses no suicidal plans and no homicidal plans.  Nursing note and vitals  reviewed.       Assessment & Plan:  Left foot fracture- will refer to ortho for evaluation rule out malunion stop meloxicam due to GERD- has appointment May 19th at 2 GERD likely NSAID induced- will give nexium samples, can take tylenol, will refer to ortho.  Anxiety- will refill Xanax 0.5 #60 NR, suggest counseling Hypothyroid- will recheck thyroid since recent increase in dose.

## 2014-10-29 ENCOUNTER — Telehealth: Payer: Self-pay | Admitting: Physician Assistant

## 2014-10-29 DIAGNOSIS — Z9119 Patient's noncompliance with other medical treatment and regimen: Secondary | ICD-10-CM | POA: Insufficient documentation

## 2014-10-29 DIAGNOSIS — Z91199 Patient's noncompliance with other medical treatment and regimen due to unspecified reason: Secondary | ICD-10-CM

## 2014-10-29 NOTE — Telephone Encounter (Signed)
Patient called and informed of FMLA papers that given her leave until 11/05/2014. She was reminded of her appt with ortho today but states she will need to reschedule due to car trouble, she has history of noncompliance/not keeping appointment and it was emphasized to her that her FMLA is only good until the 30th and she will have to see ortho prior to that if she needs more time for her foot.

## 2014-11-07 ENCOUNTER — Institutional Professional Consult (permissible substitution): Payer: Self-pay | Admitting: Neurology

## 2014-11-12 ENCOUNTER — Ambulatory Visit: Payer: Self-pay | Admitting: Internal Medicine

## 2014-11-22 ENCOUNTER — Ambulatory Visit: Payer: Self-pay | Admitting: Physician Assistant

## 2014-12-17 ENCOUNTER — Ambulatory Visit: Payer: Self-pay | Admitting: Physician Assistant

## 2015-01-15 ENCOUNTER — Ambulatory Visit (INDEPENDENT_AMBULATORY_CARE_PROVIDER_SITE_OTHER): Payer: BLUE CROSS/BLUE SHIELD | Admitting: Internal Medicine

## 2015-01-15 ENCOUNTER — Encounter: Payer: Self-pay | Admitting: Internal Medicine

## 2015-01-15 VITALS — BP 122/74 | HR 76 | Temp 97.9°F | Resp 16 | Ht 63.0 in | Wt 118.0 lb

## 2015-01-15 DIAGNOSIS — R7309 Other abnormal glucose: Secondary | ICD-10-CM

## 2015-01-15 DIAGNOSIS — J0101 Acute recurrent maxillary sinusitis: Secondary | ICD-10-CM

## 2015-01-15 DIAGNOSIS — E559 Vitamin D deficiency, unspecified: Secondary | ICD-10-CM

## 2015-01-15 DIAGNOSIS — I10 Essential (primary) hypertension: Secondary | ICD-10-CM | POA: Diagnosis not present

## 2015-01-15 DIAGNOSIS — G4700 Insomnia, unspecified: Secondary | ICD-10-CM | POA: Diagnosis not present

## 2015-01-15 DIAGNOSIS — E785 Hyperlipidemia, unspecified: Secondary | ICD-10-CM

## 2015-01-15 DIAGNOSIS — E039 Hypothyroidism, unspecified: Secondary | ICD-10-CM

## 2015-01-15 DIAGNOSIS — Z6820 Body mass index (BMI) 20.0-20.9, adult: Secondary | ICD-10-CM

## 2015-01-15 DIAGNOSIS — Z79899 Other long term (current) drug therapy: Secondary | ICD-10-CM | POA: Diagnosis not present

## 2015-01-15 LAB — CBC WITH DIFFERENTIAL/PLATELET
BASOS ABS: 0 10*3/uL (ref 0.0–0.1)
Basophils Relative: 0 % (ref 0–1)
EOS ABS: 0 10*3/uL (ref 0.0–0.7)
EOS PCT: 0 % (ref 0–5)
HCT: 33.7 % — ABNORMAL LOW (ref 36.0–46.0)
Hemoglobin: 11.6 g/dL — ABNORMAL LOW (ref 12.0–15.0)
LYMPHS PCT: 20 % (ref 12–46)
Lymphs Abs: 1.5 10*3/uL (ref 0.7–4.0)
MCH: 33.1 pg (ref 26.0–34.0)
MCHC: 34.4 g/dL (ref 30.0–36.0)
MCV: 96.3 fL (ref 78.0–100.0)
MPV: 9.9 fL (ref 8.6–12.4)
Monocytes Absolute: 0.4 10*3/uL (ref 0.1–1.0)
Monocytes Relative: 5 % (ref 3–12)
NEUTROS ABS: 5.6 10*3/uL (ref 1.7–7.7)
Neutrophils Relative %: 75 % (ref 43–77)
Platelets: 203 10*3/uL (ref 150–400)
RBC: 3.5 MIL/uL — AB (ref 3.87–5.11)
RDW: 13.2 % (ref 11.5–15.5)
WBC: 7.4 10*3/uL (ref 4.0–10.5)

## 2015-01-15 MED ORDER — AZITHROMYCIN 250 MG PO TABS: ORAL_TABLET | ORAL | Status: DC

## 2015-01-15 MED ORDER — ESZOPICLONE 3 MG PO TABS: ORAL_TABLET | ORAL | Status: DC

## 2015-01-15 MED ORDER — PREDNISONE 20 MG PO TABS: ORAL_TABLET | ORAL | Status: DC

## 2015-01-15 NOTE — Progress Notes (Signed)
Patient ID: Stacy [name], female   DOB: 1961-02-25, 54 y.o.   MRN: [id number]   This very nice 54 y.o. DWF presents for follow up with hx/o labile Hypertension, Hyperlipidemia, hx/o elevated glucose and Vitamin D Deficiency. Patient relates hx/o Grave's Dz at age 32 in 65 and being treated with RAI*131 x 2 and has since been on thyroid replacement. Today she also c/o pressure and tenderness over her frontal & maxillary sinuses.    Patient has hx/o labile elevated BP & is monitored expectantly for HTN. Today's BP: 122/74 mmHg. Patient has had no complaints of any cardiac type chest pain, palpitations, dyspnea/orthopnea/PND, dizziness, claudication, or dependent edema.   Hyperlipidemia is controlled with diet. Patient denies myalgias or other med SE's. Last Lipids were near goal - Cholesterol 205; HDL 93; LDL 104; Trig 40 on 08/08/2014.   Also, the patient has history of elevated glucoses and screened expectantly for prediabetes and has had no symptoms of reactive hypoglycemia, diabetic polys, paresthesias or visual blurring.     Further, the patient also has history of Vitamin D Deficiency and supplements vitamin D without any suspected side-effects. Last vitamin D was low at 45 in Mar 2015.      Medication Sig  . acyclovir (ZOVIRAX) 200 MG capsule Take 1 capsule (200 mg total) by mouth 2 (two) times daily.  Marland Kitchen ALPRAZolam (XANAX) 0.5 MG tablet Take 1 tablet (0.5 mg total) by mouth 3 (three) times daily as needed for sleep or anxiety.  Marland Kitchen escitalopram (LEXAPRO) 20 MG tablet Take 1 tablet (20 mg total) by mouth daily.  . Eszopiclone 3 MG TABS take 1 tablet by mouth at bedtime if needed for sleep  . Levothyroxine 88 MCG tablet take 1 tablet by mouth once daily   Allergies  Allergen Reactions  . Ambien [Zolpidem Tartrate] Other (See Comments)    hallucinations   PMHx:   Past Medical History  Diagnosis Date  . Hypertension   . Anxiety   . Depression   . Unspecified hypothyroidism   .  Insomnia   . HSV (herpes simplex virus) infection   . Allergy   . Hyperlipidemia    Immunization History  Administered Date(s) Administered  . Tdap 06/08/2006   Past Surgical History  Procedure Laterality Date  . Cosmetic surgery      breast implants saline  . Colposcopy     FHx:    Reviewed / unchanged  SHx:    Reviewed / unchanged  Systems Review:  Constitutional: Denies fever, chills, wt changes, headaches, insomnia, fatigue, night sweats, change in appetite. Eyes: Denies redness, blurred vision, diplopia, discharge, itchy, watery eyes.  ENT: Denies discharge, congestion, post nasal drip, epistaxis, sore throat, earache, hearing loss, dental pain, tinnitus, vertigo, sinus pain, snoring.  CV: Denies chest pain, palpitations, irregular heartbeat, syncope, dyspnea, diaphoresis, orthopnea, PND, claudication or edema. Respiratory: denies cough, dyspnea, DOE, pleurisy, hoarseness, laryngitis, wheezing.  Gastrointestinal: Denies dysphagia, odynophagia, heartburn, reflux, water brash, abdominal pain or cramps, nausea, vomiting, bloating, diarrhea, constipation, hematemesis, melena, hematochezia  or hemorrhoids. Genitourinary: Denies dysuria, frequency, urgency, nocturia, hesitancy, discharge, hematuria or flank pain. Musculoskeletal: Denies arthralgias, myalgias, stiffness, jt. swelling, pain, limping or strain/sprain.  Skin: Denies pruritus, rash, hives, warts, acne, eczema or change in skin lesion(s). Neuro: No weakness, tremor, incoordination, spasms, paresthesia or pain. Psychiatric: Denies confusion, memory loss or sensory loss. Endo: Denies change in weight, skin or hair change.  Heme/Lymph: No excessive bleeding, bruising or enlarged lymph nodes.  Physical Exam  BP  122/74   Pulse 76  Temp 97.9 F   Resp 16  Ht 5\' 3"    Wt 118 lb     BMI 20.91  Appears well nourished and in no distress.  Eyes: PERRLA, EOMs, conjunctiva no swelling or erythema. Sinuses: Tender frontal &  maxillary areas. ENT/Mouth: EAC's clear, TM's nl w/o erythema, bulging. Nares clear w/o erythema, swelling, exudates. Oropharynx clear without erythema or exudates. Oral hygiene is good. Tongue normal, non obstructing. Hearing intact.  Neck: Supple. Thyroid nl. Car 2+/2+ without bruits, nodes or JVD. Chest: Respirations nl with BS clear & equal w/o rales, rhonchi, wheezing or stridor.  Cor: Heart sounds normal w/ regular rate and rhythm without sig. murmurs, gallops, clicks, or rubs. Peripheral pulses normal and equal  without edema.  Abdomen: Soft & bowel sounds normal. Non-tender w/o guarding, rebound, hernias, masses, or organomegaly.  Lymphatics: Unremarkable.  Musculoskeletal: Full ROM all peripheral extremities, joint stability, 5/5 strength, and normal gait.  Skin: Warm, dry without exposed rashes, lesions or ecchymosis apparent.  Neuro: Cranial nerves intact, reflexes equal bilaterally. Sensory-motor testing grossly intact. Tendon reflexes grossly intact.  Pysch: Alert & oriented x 3.  Insight and judgement nl & appropriate. No ideations.  Assessment and Plan:  1. Essential hypertension  - TSH  2. Hyperlipidemia  - Lipid panel  3. Abnormal glucose  - Hemoglobin A1c - Insulin, random  4. Hypothyroidism  - TSH  5. Vitamin D deficiency  - Vit D  25 hydroxy   6. Acute recurrent maxillary sinusitis  - predniSONE (DELTASONE) 20 MG tablet; 1 tab 3 x day for 3 days, then 1 tab 2 x day for 3 days, then 1 tab 1 x day for 5 days  Dispense: 20 tablet; Refill: 0 - azithromycin (ZITHROMAX) 250 MG tablet; Take 2 tablets (500 mg) on  Day 1,  followed by 1 tablet (250 mg) once daily on Days 2 through 5.  Dispense: 6 each; Refill: 1  7. Insomnia  - Eszopiclone 3 MG TABS; take 1 tablet by mouth at bedtime if needed for sleep  Dispense: 30 tablet; Refill: 1  8. Body mass index (BMI) of 20.0-20.9 in adult  9. Medication management  - CBC with Differential/Platelet - BASIC  METABOLIC PANEL WITH GFR - Hepatic function panel - Magnesium     Recommended regular exercise, BP monitoring, weight control, and discussed med and SE's. Recommended labs to assess and monitor clinical status. Further disposition pending results of labs. Over 30 minutes of exam, counseling, chart review was performed

## 2015-01-15 NOTE — Patient Instructions (Addendum)
[email or web address]  Tumeric Extract 900 mg with Bioperine  2 x day    +++++++++++++++++++++++++++++++++++++++ Recommend Adult Low dose Aspirin or coated  Aspirin 81 mg daily   To reduce risk of Colon Cancer 20 %,   Skin Cancer 26 % ,   Melanoma 46%   and   Pancreatic cancer 60% ++++++++++++++++++ Vitamin D goal is between 70-100.   Please make sure that you are taking your Vitamin D as directed.   It is very important as a natural anti-inflammatory   helping hair, skin, and nails, as well as reducing stroke and heart attack risk.   It helps your bones and helps with mood.  It also decreases numerous cancer risks so please take it as directed.   Low Vit D is associated with a 200-300% higher risk for CANCER   and 200-300% higher risk for HEART   ATTACK  &  STROKE.   .....................................Marland Kitchen  It is also associated with higher death rate at younger ages,   autoimmune diseases like Rheumatoid arthritis, Lupus, Multiple Sclerosis.     Also many other serious conditions, like depression, Alzheimer's  Dementia, infertility, muscle aches, fatigue, fibromyalgia - just to name a few.  +++++++++++++++++++  Recommend the book "The END of DIETING" by Dr Monico Hoar   & the book "The END of DIABETES " by Dr Monico Hoar  At [email or web address] - get book & Audio CD's     Being diabetic has a  300% increased risk for heart attack, stroke, cancer, and alzheimer- type vascular dementia. It is very important that you work harder with diet by avoiding all foods that are white. Avoid white rice (brown & wild rice is OK), white potatoes (sweetpotatoes in moderation is OK), White bread or wheat bread or anything made out of white flour like bagels, donuts, rolls, buns, biscuits, cakes, pastries, cookies, pizza crust, and pasta (made from white flour & egg whites) - vegetarian pasta or spinach or wheat pasta is OK. Multigrain breads like Arnold's or Pepperidge Farm, or multigrain  sandwich thins or flatbreads.  Diet, exercise and weight loss can reverse and cure diabetes in the early stages.  Diet, exercise and weight loss is very important in the control and prevention of complications of diabetes which affects every system in your body, ie. Brain - dementia/stroke, eyes - glaucoma/blindness, heart - heart attack/heart failure, kidneys - dialysis, stomach - gastric paralysis, intestines - malabsorption, nerves - severe painful neuritis, circulation - gangrene & loss of a leg(s), and finally cancer and Alzheimers.    I recommend avoid fried & greasy foods,  sweets/candy, white rice (brown or wild rice or Quinoa is OK), white potatoes (sweet potatoes are OK) - anything made from white flour - bagels, doughnuts, rolls, buns, biscuits,white and wheat breads, pizza crust and traditional pasta made of white flour & egg white(vegetarian pasta or spinach or wheat pasta is OK).  Multi-grain bread is OK - like multi-grain flat bread or sandwich thins. Avoid alcohol in excess. Exercise is also important.    Eat all the vegetables you want - avoid meat, especially red meat and dairy - especially cheese.  Cheese is the most concentrated form of trans-fats which is the worst thing to clog up our arteries. Veggie cheese is OK which can be found in the fresh produce section at [facility] or [employer] or [employer]  ++++++++++++++++++++++++++

## 2015-01-16 ENCOUNTER — Other Ambulatory Visit: Payer: Self-pay

## 2015-01-16 LAB — LIPID PANEL
Cholesterol: 187 mg/dL (ref 125–200)
HDL: 91 mg/dL (ref >=46)
LDL CALC: 86 mg/dL (ref <130)
Total CHOL/HDL Ratio: 2.1 Ratio (ref <=5.0)
Triglycerides: 49 mg/dL (ref <150)
VLDL: 10 mg/dL (ref <30)

## 2015-01-16 LAB — HEPATIC FUNCTION PANEL
ALBUMIN: 4.5 g/dL (ref 3.6–5.1)
ALK PHOS: 42 U/L (ref 33–130)
ALT: 11 U/L (ref 6–29)
AST: 20 U/L (ref 10–35)
BILIRUBIN INDIRECT: 0.4 mg/dL (ref 0.2–1.2)
BILIRUBIN TOTAL: 0.5 mg/dL (ref 0.2–1.2)
Bilirubin, Direct: 0.1 mg/dL (ref <=0.2)
Total Protein: 7 g/dL (ref 6.1–8.1)

## 2015-01-16 LAB — MAGNESIUM: MAGNESIUM: 1.8 mg/dL (ref 1.5–2.5)

## 2015-01-16 LAB — BASIC METABOLIC PANEL WITH GFR
BUN: 6 mg/dL — ABNORMAL LOW (ref 7–25)
CHLORIDE: 107 mmol/L (ref 98–110)
CO2: 23 mmol/L (ref 20–31)
Calcium: 9.6 mg/dL (ref 8.6–10.4)
Creat: 0.63 mg/dL (ref 0.50–1.05)
GFR, Est African American: >89 mL/min (ref >=60)
GLUCOSE: 76 mg/dL (ref 65–99)
Potassium: 4.6 mmol/L (ref 3.5–5.3)
Sodium: 141 mmol/L (ref 135–146)

## 2015-01-16 LAB — HEMOGLOBIN A1C
Hgb A1c MFr Bld: 5.3 % (ref <5.7)
Mean Plasma Glucose: 105 mg/dL (ref <117)

## 2015-01-16 LAB — TSH: TSH: 0.113 u[IU]/mL — AB (ref 0.350–4.500)

## 2015-01-16 LAB — INSULIN, RANDOM: Insulin: 1.3 u[IU]/mL — ABNORMAL LOW (ref 2.0–19.6)

## 2015-01-16 LAB — VITAMIN D 25 HYDROXY (VIT D DEFICIENCY, FRACTURES): Vit D, 25-Hydroxy: 33 ng/mL (ref 30–100)

## 2015-01-16 MED ORDER — ESZOPICLONE 3 MG PO TABS
3.0000 mg | ORAL_TABLET | Freq: Every evening | ORAL | Status: DC | PRN
Start: 2015-01-16 — End: 2015-01-24

## 2015-01-17 ENCOUNTER — Other Ambulatory Visit: Payer: Self-pay

## 2015-01-17 MED ORDER — LEVOFLOXACIN 500 MG PO TABS
500.0000 mg | ORAL_TABLET | Freq: Every day | ORAL | Status: AC
Start: 2015-01-17 — End: 2015-01-27

## 2015-01-24 ENCOUNTER — Other Ambulatory Visit: Payer: Self-pay | Admitting: Internal Medicine

## 2015-01-24 DIAGNOSIS — G4700 Insomnia, unspecified: Secondary | ICD-10-CM

## 2015-01-24 MED ORDER — ESZOPICLONE 3 MG PO TABS: 3.0000 mg | ORAL_TABLET | Freq: Every evening | ORAL | Status: DC | PRN

## 2015-02-08 ENCOUNTER — Telehealth: Payer: Self-pay | Admitting: *Deleted

## 2015-02-08 MED ORDER — QUETIAPINE FUMARATE 50 MG PO TABS: ORAL_TABLET | ORAL | Status: DC

## 2015-02-08 NOTE — Telephone Encounter (Signed)
Patient called and states she has taken #30 Lunesta since her refill on 01/24/2015.  Patient requested a refill or possibly a new RX for Seroquel.  Per Quentin Mulling, PA,  No refill on the Lunesta but OK to send in an Rx for Seroquel 50 mg.  Patient aware.

## 2015-02-20 ENCOUNTER — Telehealth: Payer: Self-pay | Admitting: Internal Medicine

## 2015-02-20 NOTE — Telephone Encounter (Signed)
Patient called stating that Lunesta not helping her sleep, taking as prescribed.  Per Dr. Oneta Rack & Quentin Mulling, no more sleep medicine. They highly recommend that she goes to [facility], as she was referred 09-2014. Left voicemail for patient.  Thank you, Theodoro Kalata Referral Coordinator  [facility] [phone] ext. [phone] Fax [phone]

## 2015-02-27 ENCOUNTER — Ambulatory Visit: Payer: Self-pay | Admitting: Physician Assistant

## 2015-03-06 ENCOUNTER — Ambulatory Visit: Payer: Self-pay | Admitting: Physician Assistant

## 2015-03-13 ENCOUNTER — Ambulatory Visit: Payer: Self-pay | Admitting: Physician Assistant

## 2015-03-27 ENCOUNTER — Ambulatory Visit: Payer: Self-pay | Admitting: Physician Assistant

## 2015-04-02 ENCOUNTER — Ambulatory Visit (INDEPENDENT_AMBULATORY_CARE_PROVIDER_SITE_OTHER): Payer: BLUE CROSS/BLUE SHIELD | Admitting: Physician Assistant

## 2015-04-02 ENCOUNTER — Encounter: Payer: Self-pay | Admitting: Physician Assistant

## 2015-04-02 VITALS — BP 120/62 | HR 79 | Temp 97.9°F | Resp 16 | Ht 63.0 in | Wt 116.0 lb

## 2015-04-02 DIAGNOSIS — E039 Hypothyroidism, unspecified: Secondary | ICD-10-CM

## 2015-04-02 DIAGNOSIS — F329 Major depressive disorder, single episode, unspecified: Secondary | ICD-10-CM

## 2015-04-02 DIAGNOSIS — E2839 Other primary ovarian failure: Secondary | ICD-10-CM

## 2015-04-02 DIAGNOSIS — R002 Palpitations: Secondary | ICD-10-CM

## 2015-04-02 DIAGNOSIS — G4700 Insomnia, unspecified: Secondary | ICD-10-CM | POA: Diagnosis not present

## 2015-04-02 DIAGNOSIS — F419 Anxiety disorder, unspecified: Secondary | ICD-10-CM

## 2015-04-02 DIAGNOSIS — F32A Depression, unspecified: Secondary | ICD-10-CM

## 2015-04-02 LAB — CBC WITH DIFFERENTIAL/PLATELET
BASOS ABS: 0.1 10*3/uL (ref 0.0–0.1)
BASOS PCT: 1 % (ref 0–1)
Eosinophils Absolute: 0.1 10*3/uL (ref 0.0–0.7)
Eosinophils Relative: 1 % (ref 0–5)
HCT: 33.8 % — ABNORMAL LOW (ref 36.0–46.0)
Hemoglobin: 11.5 g/dL — ABNORMAL LOW (ref 12.0–15.0)
Lymphocytes Relative: 19 % (ref 12–46)
Lymphs Abs: 1 10*3/uL (ref 0.7–4.0)
MCH: 32.6 pg (ref 26.0–34.0)
MCHC: 34 g/dL (ref 30.0–36.0)
MCV: 95.8 fL (ref 78.0–100.0)
MONOS PCT: 6 % (ref 3–12)
MPV: 10.4 fL (ref 8.6–12.4)
Monocytes Absolute: 0.3 10*3/uL (ref 0.1–1.0)
NEUTROS ABS: 4 10*3/uL (ref 1.7–7.7)
NEUTROS PCT: 73 % (ref 43–77)
Platelets: 194 10*3/uL (ref 150–400)
RBC: 3.53 MIL/uL — ABNORMAL LOW (ref 3.87–5.11)
RDW: 13.6 % (ref 11.5–15.5)
WBC: 5.5 10*3/uL (ref 4.0–10.5)

## 2015-04-02 LAB — BASIC METABOLIC PANEL WITH GFR
BUN: 13 mg/dL (ref 7–25)
CHLORIDE: 97 mmol/L — AB (ref 98–110)
CO2: 25 mmol/L (ref 20–31)
CREATININE: 0.54 mg/dL (ref 0.50–1.05)
Calcium: 9.9 mg/dL (ref 8.6–10.4)
GFR, Est African American: >89 mL/min (ref >=60)
GFR, Est Non African American: >89 mL/min (ref >=60)
Glucose, Bld: 101 mg/dL — ABNORMAL HIGH (ref 65–99)
POTASSIUM: 4.2 mmol/L (ref 3.5–5.3)
SODIUM: 131 mmol/L — AB (ref 135–146)

## 2015-04-02 LAB — TSH: TSH: 0.535 u[IU]/mL (ref 0.350–4.500)

## 2015-04-02 LAB — HEPATIC FUNCTION PANEL
ALK PHOS: 53 U/L (ref 33–130)
ALT: 17 U/L (ref 6–29)
AST: 25 U/L (ref 10–35)
Albumin: 4.4 g/dL (ref 3.6–5.1)
BILIRUBIN DIRECT: 0.1 mg/dL (ref <=0.2)
BILIRUBIN INDIRECT: 0.5 mg/dL (ref 0.2–1.2)
BILIRUBIN TOTAL: 0.6 mg/dL (ref 0.2–1.2)
Total Protein: 6.7 g/dL (ref 6.1–8.1)

## 2015-04-02 MED ORDER — LEVOTHYROXINE SODIUM 88 MCG PO TABS: 88.0000 ug | ORAL_TABLET | Freq: Every day | ORAL | Status: DC

## 2015-04-02 MED ORDER — QUETIAPINE FUMARATE 100 MG PO TABS: 100.0000 mg | ORAL_TABLET | Freq: Every day | ORAL | Status: DC

## 2015-04-02 MED ORDER — ESCITALOPRAM OXALATE 20 MG PO TABS: 20.0000 mg | ORAL_TABLET | Freq: Every day | ORAL | Status: DC

## 2015-04-02 NOTE — Progress Notes (Signed)
Subjective:    Patient ID: Stacy [name], female    DOB: 06/22/1960, 54 y.o.   MRN: [id number]  HPI 54 y.o. WF presents with right hand pain. She has been following with Dr. Marianna PaymentKendell for previous 5th metatarsal fracture in May, but then this was healing so had another fracture while at work of 2nd metatarsal fracture. Will get bone density with recent fractures.   She has been having decreased sleep, she has not been work due to her foot and needs a sleep medication She was declines sleep medications because at one point she took 3930 lunesta in a 10 day period with her trips to [place]. Has been on seroquel in the past, will restart it but she will still be encouraged to follow up with Dr. Vickey Hugerohmeier.   She has been under extra stress with trying to work with her job on her fracture on her foot. She has not been sleeping, staying with her mom, no SI/HI. Has not been on her lexapro.   Blood pressure 120/62, pulse 79, temperature 97.9 F (36.6 C), temperature source Temporal, resp. rate 16, height 5\' 3"  (1.6 m), weight 116 lb (52.617 kg), SpO2 99 %.  Past Medical History  Diagnosis Date  . Hypertension   . Anxiety   . Depression   . Unspecified hypothyroidism   . Insomnia   . HSV (herpes simplex virus) infection   . Allergy   . Hyperlipidemia    Current Outpatient Prescriptions on File Prior to Visit  Medication Sig Dispense Refill  . acyclovir (ZOVIRAX) 200 MG capsule Take 1 capsule (200 mg total) by mouth 2 (two) times daily. 60 capsule 3  . escitalopram (LEXAPRO) 20 MG tablet Take 1 tablet (20 mg total) by mouth daily. 30 tablet 2  . Eszopiclone (ESZOPICLONE) 3 MG TABS Take 1 tablet (3 mg total) by mouth at bedtime as needed. Take immediately before bedtime 30 tablet 0  . levothyroxine (SYNTHROID, LEVOTHROID) 88 MCG tablet Take 88 mcg by mouth daily.  0  . QUEtiapine (SEROQUEL) 50 MG tablet Take 1-2 tablets at bedtime prn sleep. 60 tablet 0   No current facility-administered  medications on file prior to visit.    Review of Systems  Constitutional: Negative.   HENT: Negative.   Respiratory: Positive for chest tightness. Negative for shortness of breath and wheezing.   Cardiovascular: Positive for palpitations. Negative for chest pain and leg swelling.  Gastrointestinal: Negative.  Negative for diarrhea.  Genitourinary: Negative.   Musculoskeletal: Positive for joint swelling and arthralgias.  Skin: Negative.   Neurological: Negative.  Negative for dizziness.  Psychiatric/Behavioral: Positive for sleep disturbance, decreased concentration and agitation. Negative for suicidal ideas, hallucinations, behavioral problems, confusion, self-injury and dysphoric mood. The patient is nervous/anxious. The patient is not hyperactive.        Objective:   Physical Exam  Constitutional: She is oriented to person, place, and time. She appears well-developed and well-nourished. No distress.  HENT:  Head: Normocephalic and atraumatic.  Mouth/Throat: No oropharyngeal exudate.  Eyes: Conjunctivae and EOM are normal. Pupils are equal, round, and reactive to light. No scleral icterus.  Neck: Normal range of motion. Neck supple. No JVD present. No thyromegaly present.  Cardiovascular: Normal rate, regular rhythm, normal heart sounds and intact distal pulses.  Exam reveals no gallop and no friction rub.   No murmur heard. Pulmonary/Chest: Effort normal and breath sounds normal. No respiratory distress. She has no wheezes. She has no rales. She exhibits no tenderness.  Abdominal: Soft. Bowel sounds are normal. She exhibits no distension and no mass. There is no tenderness. There is no rebound and no guarding.  Musculoskeletal: Normal range of motion.  Left foot in a soft boot  Lymphadenopathy:    She has no cervical adenopathy.  Neurological: She is alert and oriented to person, place, and time.  Skin: Skin is warm and dry. She is not diaphoretic.  Psychiatric: Her speech is  normal and behavior is normal. Judgment and thought content normal. Her mood appears anxious. Cognition and memory are normal. She expresses no homicidal and no suicidal ideation. She expresses no suicidal plans and no homicidal plans.  Nursing note and vitals reviewed.         Assessment & Plan:  1. Depression Will refill - escitalopram (LEXAPRO) 20 MG tablet; Take 1 tablet (20 mg total) by mouth daily.  Dispense: 30 tablet; Refill: 2  2. Anxiety Suggest counseling, will refill - escitalopram (LEXAPRO) 20 MG tablet; Take 1 tablet (20 mg total) by mouth daily.  Dispense: 30 tablet; Refill: 2  3. Palpitations Normal EKG, check labs, check TSH - CBC with Differential/Platelet - BASIC METABOLIC PANEL WITH GFR - Hepatic function panel - EKG 12-Lead  4. Hypothyroidism, unspecified hypothyroidism type - TSH  5. Insomnia LONG discussion about medications and sleep, will try Seroquel but STRONGLY suggest seeing neuro for possible sleep evaluation  6. Estrogen deficiency With 2 recent foot fractures will get DEXA - DG Bone Density; Future

## 2015-04-03 ENCOUNTER — Ambulatory Visit: Payer: Self-pay | Admitting: Physician Assistant

## 2015-04-22 ENCOUNTER — Ambulatory Visit (INDEPENDENT_AMBULATORY_CARE_PROVIDER_SITE_OTHER): Payer: BLUE CROSS/BLUE SHIELD | Admitting: Physician Assistant

## 2015-04-22 ENCOUNTER — Encounter: Payer: Self-pay | Admitting: Physician Assistant

## 2015-04-22 VITALS — BP 124/80 | HR 89 | Temp 97.0°F | Resp 16 | Ht 63.0 in | Wt 113.0 lb

## 2015-04-22 DIAGNOSIS — G4700 Insomnia, unspecified: Secondary | ICD-10-CM | POA: Diagnosis not present

## 2015-04-22 DIAGNOSIS — E039 Hypothyroidism, unspecified: Secondary | ICD-10-CM

## 2015-04-22 DIAGNOSIS — R110 Nausea: Secondary | ICD-10-CM | POA: Diagnosis not present

## 2015-04-22 DIAGNOSIS — F1010 Alcohol abuse, uncomplicated: Secondary | ICD-10-CM

## 2015-04-22 MED ORDER — ONDANSETRON HCL 4 MG PO TABS: 4.0000 mg | ORAL_TABLET | Freq: Every day | ORAL | Status: DC | PRN

## 2015-04-22 MED ORDER — DIAZEPAM 5 MG PO TABS: 5.0000 mg | ORAL_TABLET | Freq: Every evening | ORAL | Status: DC | PRN

## 2015-04-22 NOTE — Progress Notes (Signed)
Subjective:    Patient ID: Stacy [name], female    DOB: 06/17/1960, 54 y.o.   MRN: [id number]  HPI 54 y.o. WF presents with nausea. She states 3 days ago has had nausea, no appetite, has had fast heart rates, has not been sleeping, chills, headache but has improved. Has had diarhea once, has been drinking ginger ale. Has been taking aleve for headache. Denies muscle aches, fever, no AB pain, no black/dark stool, blood in stool.  She has been very stressed with a lot of personal issues going on.  She has not been traveling due to right foot.   [facility], states she started to drink again due to stress, 3 glasses of wine, stopped 5 days ago. Has never had withdrawals in the past. She has been on seroquel for sleep in the past, is on 100 mg but it is not helping.   Blood pressure 124/80, pulse 89, temperature 97 F (36.1 C), temperature source Temporal, resp. rate 16, height  (1.6 m), weight 113 lb (51.256 kg), SpO2 98 %.  Current Outpatient Prescriptions on File Prior to Visit  Medication Sig Dispense Refill  . acyclovir (ZOVIRAX) 200 MG capsule Take 1 capsule (200 mg total) by mouth 2 (two) times daily. 60 capsule 3  . escitalopram (LEXAPRO) 20 MG tablet Take 1 tablet (20 mg total) by mouth daily. 30 tablet 2  . levothyroxine (SYNTHROID, LEVOTHROID) 88 MCG tablet Take 1 tablet (88 mcg total) by mouth daily. 90 tablet 0  . QUEtiapine (SEROQUEL) 100 MG tablet Take 1 tablet (100 mg total) by mouth at bedtime. 30 tablet 1   No current facility-administered medications on file prior to visit.   Past Medical History  Diagnosis Date  . Hypertension   . Anxiety   . Depression   . Unspecified hypothyroidism   . Insomnia   . HSV (herpes simplex virus) infection   . Allergy   . Hyperlipidemia     Review of Systems  Constitutional: Positive for fatigue. Negative for fever, chills, diaphoresis, activity change, appetite change and unexpected  weight change.  Respiratory: Negative.  Negative for shortness of breath.   Cardiovascular: Negative.  Negative for chest pain.  Gastrointestinal: Positive for nausea and diarrhea (x 1 ). Negative for vomiting, abdominal pain, constipation, blood in stool, abdominal distention, anal bleeding and rectal pain.  Genitourinary: Negative.  Negative for dysuria, urgency, frequency and decreased urine volume.  Musculoskeletal: Positive for myalgias. Negative for back pain, joint swelling, arthralgias, gait problem, neck pain and neck stiffness.  Neurological: Positive for headaches. Negative for dizziness, tremors, seizures, syncope, facial asymmetry, speech difficulty, weakness, light-headedness and numbness.  Hematological: Negative.   Psychiatric/Behavioral: Positive for behavioral problems, confusion, sleep disturbance, dysphoric mood and decreased concentration. Negative for suicidal ideas, hallucinations, self-injury and agitation. The patient is nervous/anxious. The patient is not hyperactive.        Objective:   Physical Exam  Constitutional: She is oriented to person, place, and time. She appears well-developed and well-nourished. No distress.  HENT:  Head: Normocephalic and atraumatic.  Mouth/Throat: No oropharyngeal exudate.  Eyes: Conjunctivae and EOM are normal. Pupils are equal, round, and reactive to light. No scleral icterus.  Neck: Normal range of motion. Neck supple. No JVD present. No thyromegaly present.  Cardiovascular: Normal rate, regular rhythm, normal heart sounds and intact distal pulses.  Exam reveals no gallop and no friction rub.   No murmur heard. Pulmonary/Chest: Effort  normal and breath sounds normal. No respiratory distress. She has no wheezes. She has no rales. She exhibits no tenderness.  Abdominal: Soft. Bowel sounds are normal. She exhibits no distension and no mass. There is tenderness (epigastric). There is no rebound and no guarding.  Musculoskeletal: Normal  range of motion.  Left foot in a soft boot  Lymphadenopathy:    She has no cervical adenopathy.  Neurological: She is alert and oriented to person, place, and time. No cranial nerve deficit.  Skin: Skin is warm and dry. She is not diaphoretic.  Psychiatric: Her mood appears anxious. Her affect is not angry, not blunt, not labile and not inappropriate. Her speech is rapid and/or pressured. Her speech is not delayed, not tangential and not slurred. Cognition and memory are not impaired. She does not exhibit a depressed mood. She expresses no homicidal and no suicidal ideation. She expresses no suicidal plans and no homicidal plans. She is communicative. She exhibits abnormal recent memory. She exhibits normal remote memory.  crying  Nursing note and vitals reviewed.      Assessment & Plan:  Alcohol dependence- folllow up with AA, suggest psych referral, no SI/HI,  Fatigue/nausea- ? From withdrawal, GERD/ulcer from NSAID/ETOH-, will cut seroquel in half, start on valium 5mg , 1-2 at night, only giving 15- follow up in the offfce this week for evaluation close evaluation, check labs TSH, CBC, CMET Sleep disturbance- ? From alcohol versus sleep DO, follow up Dr. Marylou Flesherohmier, and follow up psych.    Follow up 1 week, go to the ER if any SI/HI

## 2015-04-22 NOTE — Patient Instructions (Addendum)
Take nexium once a day 30 mins before food in the morning Take the zofran as needed for nausea  Try 1 of the valium with the seroquel whole pill Call Dr. Marylou Flesherohmier.- Phone: [phone]   Suggest following up with AA and psych.   Follow up 1 week here in the office.   Alcohol Withdrawal Alcohol withdrawal is a group of symptoms that can develop when a person who drinks heavily and regularly stops drinking or drinks less. CAUSES Heavy and regular drinking can cause chemicals that send signals from the brain to the body (neurotransmitters) to deactivate. Alcohol withdrawal develops when deactivated neurotransmitters reactivate because a person stops drinking or drinks less. RISK FACTORS The more a person drinks and the longer he or she drinks, the greater the risk of alcohol withdrawal. Severe withdrawal is more likely to develop in someone who:  Had severe alcohol withdrawal in the past.  Had a seizure during a previous episode of alcohol withdrawal.  Is elderly.  Is pregnant.  Has been abusing drugs.  Has other medical problems, including:  Infection.  Heart, lung, or liver disease.  Seizures.  Mental health problems. SYMPTOMS Symptoms of this condition can be mild to moderate, or they can be severe. Mild to moderate symptoms may include:  Fatigue.  Nightmares.  Trouble sleeping.  Depression.  Anxiety.  Inability to think clearly.  Mood swings.  Irritability.  Loss of appetite.  Nausea or vomiting.  Clammy skin.  Extreme sweating.  Rapid heartbeat.  Shakiness.  Uncontrollable shaking (tremor). Severe symptoms may include:  Fever.  Seizures.  Severeconfusion.  Feeling or seeing things that are not there (hallucinations). Symptoms usually begin within eight hours after a person stops drinking or drinks less. They can last for weeks. DIAGNOSIS Alcohol withdrawal is diagnosed with a medical history and physical exam. Sometimes, urine and  blood tests are also done. TREATMENT Treatment may involve:  Monitoring blood pressure, pulse, and breathing.  Getting fluids through an IV tube.  Medicine to reduce anxiety.  Medicine to prevent or control seizures.  Multivitamins and B vitamins.  Having a health care provider check on you daily. If symptoms are moderate to severe or if there is a risk of severe withdrawal, treatment may be done at a hospital or treatment center. HOME CARE INSTRUCTIONS  Take medicines and vitamin supplements only as directed by your health care provider.  Do not drink alcohol.  Have someone stay with you or be available if you need help.  Drink enough fluid to keep your urine clear or pale yellow.  Consider joining a 12-step program or another alcohol support group. SEEK MEDICAL CARE IF:  Your symptoms get worse or do not go away.  You cannot keep food or water in your stomach.  You are struggling with not drinking alcohol.  You cannot stop drinking alcohol. SEEK IMMEDIATE MEDICAL CARE IF:   You have an irregular heartbeat.  You have chest pain.  You have trouble breathing.  You have symptoms of severe withdrawal, such as:  A fever.  Seizures.  Severe confusion.  Hallucinations.   This information is not intended to replace advice given to you by your health care provider. Make sure you discuss any questions you have with your health care provider.   Document Released: 03/04/2005 Document Revised: 06/15/2014 Document Reviewed: 03/13/2014 Elsevier Interactive Patient Education [address]2016 [employer]   Counseling services   [place], [place] [phone] [address], [place] [id number]  [facility].  Address: [address] [place], [place] [address] Phone-[phone]  [facility] [phone] office [email or web address] [address] [place], [place], [place]  [address]  Gale Journey, therapist  Carlus Pavlov Ph.D., clinical psychologist Cognitive-Behavior Therapy; Mood Disorders; Anxiety Disorders; Stress     Management  [facility] Sheran Luz, psychotherapist [address] [place], [place] [address] [phone]  Miguel Aschoff Ph.D., clinical psychologist [phone] office [address] [place], [place] [address] Cognitive Behavior Therapy, Depression, Bipolar, Anxiety, Grief and Loss

## 2015-04-23 LAB — COMPREHENSIVE METABOLIC PANEL
ALT: 17 U/L (ref 6–29)
AST: 22 U/L (ref 10–35)
Albumin: 5.2 g/dL — ABNORMAL HIGH (ref 3.6–5.1)
Alkaline Phosphatase: 58 U/L (ref 33–130)
BUN: 7 mg/dL (ref 7–25)
CHLORIDE: 95 mmol/L — AB (ref 98–110)
CO2: 25 mmol/L (ref 20–31)
CREATININE: 0.75 mg/dL (ref 0.50–1.05)
Calcium: 10.4 mg/dL (ref 8.6–10.4)
Glucose, Bld: 119 mg/dL — ABNORMAL HIGH (ref 65–99)
Potassium: 3.9 mmol/L (ref 3.5–5.3)
SODIUM: 129 mmol/L — AB (ref 135–146)
TOTAL PROTEIN: 7.7 g/dL (ref 6.1–8.1)
Total Bilirubin: 0.9 mg/dL (ref 0.2–1.2)

## 2015-04-23 LAB — CBC WITH DIFFERENTIAL/PLATELET
BASOS ABS: 0 10*3/uL (ref 0.0–0.1)
BASOS PCT: 0 % (ref 0–1)
EOS ABS: 0 10*3/uL (ref 0.0–0.7)
Eosinophils Relative: 0 % (ref 0–5)
HCT: 39.4 % (ref 36.0–46.0)
Hemoglobin: 13.6 g/dL (ref 12.0–15.0)
Lymphocytes Relative: 34 % (ref 12–46)
Lymphs Abs: 2.1 10*3/uL (ref 0.7–4.0)
MCH: 33.3 pg (ref 26.0–34.0)
MCHC: 34.5 g/dL (ref 30.0–36.0)
MCV: 96.3 fL (ref 78.0–100.0)
MPV: 9.6 fL (ref 8.6–12.4)
Monocytes Absolute: 0.7 10*3/uL (ref 0.1–1.0)
Monocytes Relative: 11 % (ref 3–12)
NEUTROS PCT: 55 % (ref 43–77)
Neutro Abs: 3.4 10*3/uL (ref 1.7–7.7)
PLATELETS: 289 10*3/uL (ref 150–400)
RBC: 4.09 MIL/uL (ref 3.87–5.11)
RDW: 13.7 % (ref 11.5–15.5)
WBC: 6.1 10*3/uL (ref 4.0–10.5)

## 2015-04-23 LAB — TSH: TSH: 1.893 u[IU]/mL (ref 0.350–4.500)

## 2015-05-06 ENCOUNTER — Other Ambulatory Visit: Payer: Self-pay | Admitting: Physician Assistant

## 2015-05-06 ENCOUNTER — Ambulatory Visit: Payer: Self-pay | Admitting: Physician Assistant

## 2015-05-09 ENCOUNTER — Ambulatory Visit: Payer: Self-pay | Admitting: Physician Assistant

## 2015-05-15 ENCOUNTER — Institutional Professional Consult (permissible substitution): Payer: Self-pay | Admitting: Neurology

## 2015-05-22 ENCOUNTER — Other Ambulatory Visit: Payer: Self-pay

## 2015-06-12 ENCOUNTER — Encounter: Payer: Self-pay | Admitting: Physician Assistant

## 2015-06-25 ENCOUNTER — Encounter: Payer: Self-pay | Admitting: Emergency Medicine

## 2015-07-06 ENCOUNTER — Emergency Department (HOSPITAL_COMMUNITY)
Admission: EM | Admit: 2015-07-06 | Discharge: 2015-07-06 | Disposition: A | Discharge status: Home / Self Care | Payer: BLUE CROSS/BLUE SHIELD | Attending: Emergency Medicine | Admitting: Emergency Medicine

## 2015-07-06 ENCOUNTER — Encounter (HOSPITAL_COMMUNITY): Payer: Self-pay | Admitting: Emergency Medicine

## 2015-07-06 DIAGNOSIS — Z8619 Personal history of other infectious and parasitic diseases: Secondary | ICD-10-CM | POA: Insufficient documentation

## 2015-07-06 DIAGNOSIS — F329 Major depressive disorder, single episode, unspecified: Secondary | ICD-10-CM | POA: Diagnosis not present

## 2015-07-06 DIAGNOSIS — R451 Restlessness and agitation: Secondary | ICD-10-CM | POA: Insufficient documentation

## 2015-07-06 DIAGNOSIS — I10 Essential (primary) hypertension: Secondary | ICD-10-CM | POA: Insufficient documentation

## 2015-07-06 DIAGNOSIS — Z79899 Other long term (current) drug therapy: Secondary | ICD-10-CM | POA: Diagnosis not present

## 2015-07-06 DIAGNOSIS — E039 Hypothyroidism, unspecified: Secondary | ICD-10-CM | POA: Insufficient documentation

## 2015-07-06 DIAGNOSIS — F419 Anxiety disorder, unspecified: Secondary | ICD-10-CM | POA: Insufficient documentation

## 2015-07-06 DIAGNOSIS — R51 Headache: Secondary | ICD-10-CM | POA: Insufficient documentation

## 2015-07-06 DIAGNOSIS — Z87891 Personal history of nicotine dependence: Secondary | ICD-10-CM | POA: Insufficient documentation

## 2015-07-06 DIAGNOSIS — H53149 Visual discomfort, unspecified: Secondary | ICD-10-CM | POA: Insufficient documentation

## 2015-07-06 DIAGNOSIS — R519 Headache, unspecified: Secondary | ICD-10-CM

## 2015-07-06 LAB — URINALYSIS, ROUTINE W REFLEX MICROSCOPIC
Bilirubin Urine: NEGATIVE
Glucose, UA: NEGATIVE mg/dL
Hgb urine dipstick: NEGATIVE
Ketones, ur: NEGATIVE mg/dL
Leukocytes, UA: NEGATIVE
NITRITE: NEGATIVE
PROTEIN: NEGATIVE mg/dL
SPECIFIC GRAVITY, URINE: 1.006 (ref 1.005–1.030)
pH: 6.5 (ref 5.0–8.0)

## 2015-07-06 MED ORDER — SODIUM CHLORIDE 0.9 % IV BOLUS (SEPSIS)
1000.0000 mL | Freq: Once | INTRAVENOUS | Status: AC
  Administered 2015-07-06: 1000 mL via INTRAVENOUS

## 2015-07-06 MED ORDER — DIPHENHYDRAMINE HCL 50 MG/ML IJ SOLN
25.0000 mg | Freq: Once | INTRAMUSCULAR | Status: AC
  Administered 2015-07-06: 25 mg via INTRAVENOUS
  Filled 2015-07-06: qty 1

## 2015-07-06 MED ORDER — KETOROLAC TROMETHAMINE 30 MG/ML IJ SOLN
30.0000 mg | Freq: Once | INTRAMUSCULAR | Status: AC
  Administered 2015-07-06: 30 mg via INTRAVENOUS
  Filled 2015-07-06: qty 1

## 2015-07-06 MED ORDER — METOCLOPRAMIDE HCL 5 MG/ML IJ SOLN
10.0000 mg | Freq: Once | INTRAMUSCULAR | Status: AC
  Administered 2015-07-06: 10 mg via INTRAVENOUS
  Filled 2015-07-06: qty 2

## 2015-07-06 NOTE — ED Notes (Signed)
Bed: WA06 Expected date:  Expected time:  Means of arrival:  Comments: EMS 

## 2015-07-06 NOTE — ED Notes (Addendum)
Pt wanted to return to [facility]. Nash Dimmer @ [facility] [phone]. Selena Batten stated that she will not be returning to the facility and Sharlot Gowda her therapist would be calling to speak with me about getting her a ride to a new facility. As of 7:25, [facility] has not called. Pt was discharged and placed in hallway.

## 2015-07-06 NOTE — ED Notes (Signed)
Patient from [facility] detoxing from alcohol, she has been sober for 30 days, developed headache three days ago.  Patient has history of migraines.  Also is having anxiety and not urinating often.  Patient is alert and oriented, EMS reports negative stroke scale.

## 2015-07-06 NOTE — ED Notes (Signed)
Bed: WHALA Expected date:  Expected time:  Means of arrival:  Comments: 

## 2015-07-06 NOTE — ED Notes (Signed)
Pt. Waiting for her ride; therapist to pick her up, resting.

## 2015-07-06 NOTE — ED Provider Notes (Signed)
CSN: [id number]     Arrival date & time 07/06/15  1533 History   First MD Initiated Contact with Patient 07/06/15 1538     Chief Complaint  Patient presents with  . Migraine   HPI  Stacy [name] is a 55 year old female with PMHx of HTN, anxiety, depression, alcohol abuse and insomnia presenting with headache. She reports a history of headaches that are intermittent. She states that the headaches are triggered by "feelign threatened or stressed out". She denies feeling unsafe at [facility] but does endorse feeling stressed. She states her current headache has been present for 3 days. The pain waxes and wanes in intensity. It is located over her forehead. She describes it as a pressure. She has associated photophobia and phonophobia. The pain has gradually been worsening over the past few days. She has taken tylenol without improvement in her symptoms. She states this is typical of her headaches in the past. She has followed with her PCP for headaches but does not take any preventative medications. She is currently staying at [facility] to detox from alcohol. She has been sober for 30 days. Denies fevers, chills, neck pain, neck stiffness, cold symptoms, chest pain, SOB, abdominal pain, nausea, vomiting, weakness, numbness or gait instability.  Past Medical History  Diagnosis Date  . Hypertension   . Anxiety   . Depression   . Unspecified hypothyroidism   . Insomnia   . HSV (herpes simplex virus) infection   . Allergy   . Hyperlipidemia    Past Surgical History  Procedure Laterality Date  . Cosmetic surgery      breast implants saline  . Colposcopy     Family History  Problem Relation Age of Onset  . Stroke Mother   . Leukemia Father   . Depression Sister   . Depression Sister   . Depression Sister   . Depression Sister   . Heart disease Other   . Hyperlipidemia Other   . Diabetes Other    Social History  Substance Use Topics  . Smoking status: Former Smoker -- 1.00  packs/day    Quit date: 06/08/2006  . Smokeless tobacco: None  . Alcohol Use: 1.8 oz/week    3 Glasses of wine per week   OB History    No data available     Review of Systems  Constitutional: Negative for fever and chills.  HENT: Negative for congestion, rhinorrhea and sore throat.   Eyes: Positive for photophobia. Negative for visual disturbance.  Respiratory: Negative for shortness of breath.   Cardiovascular: Negative for chest pain.  Gastrointestinal: Negative for nausea, vomiting and abdominal pain.  Musculoskeletal: Negative for back pain, neck pain and neck stiffness.  Neurological: Positive for headaches. Negative for dizziness, syncope, weakness, light-headedness and numbness.  Psychiatric/Behavioral: The patient is nervous/anxious.       Allergies  Ambien  Home Medications   Prior to Admission medications   Medication Sig Start Date End Date Taking? Authorizing Provider  acetaminophen (TYLENOL) 500 MG tablet Take 1,000 mg by mouth every 6 (six) hours as needed for moderate pain or headache.   Yes Historical Provider, MD  acyclovir (ZOVIRAX) 200 MG capsule Take 1 capsule (200 mg total) by mouth 2 (two) times daily. 09/26/14  Yes Quentin Mulling, PA-C  diazepam (VALIUM) 5 MG tablet Take 1 tablet (5 mg total) by mouth at bedtime and may repeat dose one time if needed. 04/22/15 04/21/16 Yes Quentin Mulling, PA-C  escitalopram (LEXAPRO) 20 MG tablet  Take 1 tablet (20 mg total) by mouth daily. 04/02/15 04/02/17 Yes Quentin Mulling, PA-C  Eszopiclone 3 MG TABS Take 3 mg by mouth at bedtime as needed (sleep).  06/18/15  Yes Historical Provider, MD  levothyroxine (SYNTHROID, LEVOTHROID) 88 MCG tablet Take 1 tablet (88 mcg total) by mouth daily. 04/02/15  Yes Quentin Mulling, PA-C  meloxicam (MOBIC) 15 MG tablet Take 15 mg by mouth daily. 06/05/15  Yes Historical Provider, MD  QUEtiapine (SEROQUEL) 25 MG tablet Take 25 mg by mouth at bedtime. 06/29/15  Yes Historical Provider, MD   ondansetron (ZOFRAN) 4 MG tablet Take 1 tablet (4 mg total) by mouth daily as needed for nausea or vomiting. 04/22/15 04/21/16  Quentin Mulling, PA-C  QUEtiapine (SEROQUEL) 100 MG tablet Take 1 tablet (100 mg total) by mouth at bedtime. 04/02/15   Quentin Mulling, PA-C   There were no vitals taken for this visit. Physical Exam  Constitutional: She appears well-developed and well-nourished. No distress.  HENT:  Head: Normocephalic and atraumatic.  Mouth/Throat: Oropharynx is clear and moist. No oropharyngeal exudate.  Eyes: Conjunctivae and EOM are normal. Pupils are equal, round, and reactive to light. Right eye exhibits no discharge. Left eye exhibits no discharge.  Neck: Normal range of motion. Neck supple. No rigidity.  No meningeal signs  Cardiovascular: Normal rate, regular rhythm and normal heart sounds.   Pulmonary/Chest: Effort normal and breath sounds normal. No respiratory distress.  Abdominal: Soft. There is no tenderness. There is no rebound and no guarding.  Musculoskeletal: Normal range of motion.  Neurological: She is alert. No cranial nerve deficit. She exhibits normal muscle tone. Coordination normal.  Cranial nerves 3-12 tested and intact. 5/5 strength of all major muscle groups. Sensation to light touch intact throughout. Finger to nose coordinated. Walks with a steady gait unassisted.   Skin: Skin is warm and dry.  Psychiatric: Her speech is normal. Her mood appears anxious. She is agitated.  Pt extremely agitated over the fact that there are no closets in the room. Pt angry stating "why do you ask these questions, you already know the answers".   Nursing note and vitals reviewed.   ED Course  Procedures (including critical care time) Labs Review Labs Reviewed  URINALYSIS, ROUTINE W REFLEX MICROSCOPIC (NOT AT [facility])    Imaging Review No results found. I have personally reviewed and evaluated these images and lab results as part of my medical decision-making.    EKG Interpretation None      MDM   Final diagnoses:  Headache, unspecified headache type   55 year old female presenting with a headache x 3 days. Pt has a headache history with similar presentations. Associated symptoms include photophobia and phonophobia. VSS. Non-focal neuro exam. Pain treated with fluid bolus, reglan, toradol and benadryl. No imaging indicated at this time. Pt reports significant improvement in symptoms after headache cocktail. Patient was initially anxious and agitated before pain treated. After receiving treatment, she is calm, pleasant and no longer anxious appearing. No headache red flags including fever, meningeal signs, "thunderclap", seizures, temporal tenderness, vision changes, neuro deficits, exertional, change in headache pattern, new headache age > 50, anticoagulant use, immunocompromise or AMS. Pt is stable for discharge with close follow up with PCP. Return precautions given in discharge paperwork and discussed with pt at bedside. Pt stable for discharge     Alveta Heimlich, PA-C 07/06/15 1821  Cathren Laine, MD 07/08/15 [phone]

## 2015-07-06 NOTE — ED Notes (Signed)
Patient waiting for ride

## 2015-07-06 NOTE — ED Notes (Signed)
MD notified of BP

## 2015-07-06 NOTE — Discharge Instructions (Signed)
Use OTC pain medications such as tylenol or motrin for your headaches. Schedule a follow up appointment with your PCP.   Your blood pressure is high today - follow up with your doctor for recheck of blood pressure in the coming week.       General Headache Without Cause A headache is pain or discomfort felt around the head or neck area. The specific cause of a headache may not be found. There are many causes and types of headaches. A few common ones are:  Tension headaches.  Migraine headaches.  Cluster headaches.  Chronic daily headaches. HOME CARE INSTRUCTIONS  Watch your condition for any changes. Take these steps to help with your condition: Managing Pain  Take over-the-counter and prescription medicines only as told by your health care provider.  Lie down in a dark, quiet room when you have a headache.  If directed, apply ice to the head and neck area:  Put ice in a plastic bag.  Place a towel between your skin and the bag.  Leave the ice on for 20 minutes, 2-3 times per day.  Use a heating pad or hot shower to apply heat to the head and neck area as told by your health care provider.  Keep lights dim if bright lights bother you or make your headaches worse. Eating and Drinking  Eat meals on a regular schedule.  Limit alcohol use.  Decrease the amount of caffeine you drink, or stop drinking caffeine. General Instructions  Keep all follow-up visits as told by your health care provider. This is important.  Keep a headache journal to help find out what may trigger your headaches. For example, write down:  What you eat and drink.  How much sleep you get.  Any change to your diet or medicines.  Try massage or other relaxation techniques.  Limit stress.  Sit up straight, and do not tense your muscles.  Do not use tobacco products, including cigarettes, chewing tobacco, or e-cigarettes. If you need help quitting, ask your health care provider.  Exercise  regularly as told by your health care provider.  Sleep on a regular schedule. Get 7-9 hours of sleep, or the amount recommended by your health care provider. SEEK MEDICAL CARE IF:   Your symptoms are not helped by medicine.  You have a headache that is different from the usual headache.  You have nausea or you vomit.  You have a fever. SEEK IMMEDIATE MEDICAL CARE IF:   Your headache becomes severe.  You have repeated vomiting.  You have a stiff neck.  You have a loss of vision.  You have problems with speech.  You have pain in the eye or ear.  You have muscular weakness or loss of muscle control.  You lose your balance or have trouble walking.  You feel faint or pass out.  You have confusion.   This information is not intended to replace advice given to you by your health care provider. Make sure you discuss any questions you have with your health care provider.   Document Released: 05/25/2005 Document Revised: 02/13/2015 Document Reviewed: 09/17/2014 Elsevier Interactive Patient Education 2016 [employer].      Hypertension Hypertension, commonly called high blood pressure, is when the force of blood pumping through your arteries is too strong. Your arteries are the blood vessels that carry blood from your heart throughout your body. A blood pressure reading consists of a higher number over a lower number, such as 110/72. The higher number (  systolic) is the pressure inside your arteries when your heart pumps. The lower number (diastolic) is the pressure inside your arteries when your heart relaxes. Ideally you want your blood pressure below 120/80. Hypertension forces your heart to work harder to pump blood. Your arteries may become narrow or stiff. Having untreated or uncontrolled hypertension can cause heart attack, stroke, kidney disease, and other problems. RISK FACTORS Some risk factors for high blood pressure are controllable. Others are not.  Risk factors you  cannot control include:   Race. You may be at higher risk if you are African American.  Age. Risk increases with age.  Gender. Men are at higher risk than women before age 14 years. After age 34, women are at higher risk than men. Risk factors you can control include:  Not getting enough exercise or physical activity.  Being overweight.  Getting too much fat, sugar, calories, or salt in your diet.  Drinking too much alcohol. SIGNS AND SYMPTOMS Hypertension does not usually cause signs or symptoms. Extremely high blood pressure (hypertensive crisis) may cause headache, anxiety, shortness of breath, and nosebleed. DIAGNOSIS To check if you have hypertension, your health care provider will measure your blood pressure while you are seated, with your arm held at the level of your heart. It should be measured at least twice using the same arm. Certain conditions can cause a difference in blood pressure between your right and left arms. A blood pressure reading that is higher than normal on one occasion does not mean that you need treatment. If it is not clear whether you have high blood pressure, you may be asked to return on a different day to have your blood pressure checked again. Or, you may be asked to monitor your blood pressure at home for 1 or more weeks. TREATMENT Treating high blood pressure includes making lifestyle changes and possibly taking medicine. Living a healthy lifestyle can help lower high blood pressure. You may need to change some of your habits. Lifestyle changes may include:  Following the DASH diet. This diet is high in fruits, vegetables, and whole grains. It is low in salt, red meat, and added sugars.  Keep your sodium intake below 2,300 mg per day.  Getting at least 30-45 minutes of aerobic exercise at least 4 times per week.  Losing weight if necessary.  Not smoking.  Limiting alcoholic beverages.  Learning ways to reduce stress. Your health care provider  may prescribe medicine if lifestyle changes are not enough to get your blood pressure under control, and if one of the following is true:  You are 2-89 years of age and your systolic blood pressure is above 140.  You are 74 years of age or older, and your systolic blood pressure is above 150.  Your diastolic blood pressure is above 90.  You have diabetes, and your systolic blood pressure is over 140 or your diastolic blood pressure is over 90.  You have kidney disease and your blood pressure is above 140/90.  You have heart disease and your blood pressure is above 140/90. Your personal target blood pressure may vary depending on your medical conditions, your age, and other factors. HOME CARE INSTRUCTIONS  Have your blood pressure rechecked as directed by your health care provider.   Take medicines only as directed by your health care provider. Follow the directions carefully. Blood pressure medicines must be taken as prescribed. The medicine does not work as well when you skip doses. Skipping doses also puts you  at risk for problems.  Do not smoke.   Monitor your blood pressure at home as directed by your health care provider. SEEK MEDICAL CARE IF:   You think you are having a reaction to medicines taken.  You have recurrent headaches or feel dizzy.  You have swelling in your ankles.  You have trouble with your vision. SEEK IMMEDIATE MEDICAL CARE IF:  You develop a severe headache or confusion.  You have unusual weakness, numbness, or feel faint.  You have severe chest or abdominal pain.  You vomit repeatedly.  You have trouble breathing. MAKE SURE YOU:   Understand these instructions.  Will watch your condition.  Will get help right away if you are not doing well or get worse.   This information is not intended to replace advice given to you by your health care provider. Make sure you discuss any questions you have with your health care provider.   Document  Released: 05/25/2005 Document Revised: 10/09/2014 Document Reviewed: 03/17/2013 Elsevier Interactive Patient Education [employer]

## 2015-07-07 ENCOUNTER — Other Ambulatory Visit: Payer: Self-pay | Admitting: Internal Medicine

## 2015-07-08 ENCOUNTER — Encounter (HOSPITAL_COMMUNITY): Payer: Self-pay | Admitting: Emergency Medicine

## 2015-07-08 ENCOUNTER — Emergency Department (HOSPITAL_COMMUNITY)
Admission: EM | Admit: 2015-07-08 | Discharge: 2015-07-09 | Disposition: A | Discharge status: Home / Self Care | Payer: BLUE CROSS/BLUE SHIELD | Attending: Emergency Medicine | Admitting: Emergency Medicine

## 2015-07-08 DIAGNOSIS — F1020 Alcohol dependence, uncomplicated: Secondary | ICD-10-CM | POA: Diagnosis not present

## 2015-07-08 DIAGNOSIS — F319 Bipolar disorder, unspecified: Secondary | ICD-10-CM | POA: Insufficient documentation

## 2015-07-08 DIAGNOSIS — G4700 Insomnia, unspecified: Secondary | ICD-10-CM | POA: Diagnosis not present

## 2015-07-08 DIAGNOSIS — Z8619 Personal history of other infectious and parasitic diseases: Secondary | ICD-10-CM | POA: Insufficient documentation

## 2015-07-08 DIAGNOSIS — E039 Hypothyroidism, unspecified: Secondary | ICD-10-CM | POA: Insufficient documentation

## 2015-07-08 DIAGNOSIS — Z87891 Personal history of nicotine dependence: Secondary | ICD-10-CM | POA: Insufficient documentation

## 2015-07-08 DIAGNOSIS — Z79899 Other long term (current) drug therapy: Secondary | ICD-10-CM | POA: Diagnosis not present

## 2015-07-08 DIAGNOSIS — R410 Disorientation, unspecified: Secondary | ICD-10-CM

## 2015-07-08 DIAGNOSIS — F919 Conduct disorder, unspecified: Secondary | ICD-10-CM | POA: Diagnosis present

## 2015-07-08 DIAGNOSIS — F419 Anxiety disorder, unspecified: Secondary | ICD-10-CM | POA: Insufficient documentation

## 2015-07-08 DIAGNOSIS — F1010 Alcohol abuse, uncomplicated: Secondary | ICD-10-CM

## 2015-07-08 DIAGNOSIS — I10 Essential (primary) hypertension: Secondary | ICD-10-CM | POA: Insufficient documentation

## 2015-07-08 DIAGNOSIS — N390 Urinary tract infection, site not specified: Secondary | ICD-10-CM

## 2015-07-08 MED ORDER — STERILE WATER FOR INJECTION IJ SOLN
INTRAMUSCULAR | Status: AC
Start: 2015-07-08 — End: 2015-07-09
  Administered 2015-07-09: 1 mL
  Filled 2015-07-08: qty 10

## 2015-07-08 MED ORDER — ZIPRASIDONE MESYLATE 20 MG IM SOLR
20.0000 mg | Freq: Once | INTRAMUSCULAR | Status: AC
  Administered 2015-07-09: 20 mg via INTRAMUSCULAR
  Filled 2015-07-08: qty 20

## 2015-07-08 NOTE — ED Notes (Addendum)
Family reported worsening agitation, restlessness  , hallucinations/delusions ,and  aggressive behavior onset 2 days ago . Unable to speak with RN or focus at initial encounter due to agitation .

## 2015-07-08 NOTE — ED Notes (Signed)
Dr Wilkie Aye to IVC pt.

## 2015-07-08 NOTE — ED Provider Notes (Signed)
CSN: [id number]     Arrival date & time 07/08/15  2329 History  By signing my name below, I, Stacy [name], attest that this documentation has been prepared under the direction and in the presence of Stacy Baton, MD. Electronically Signed: Budd [name], ED Scribe. 07/09/2015. 12:21 AM.    Chief Complaint  Patient presents with  . Behavior Problem   The history is provided by the patient and a relative. No language interpreter was used.   HPI Comments: Level 5 Caveat (AMS)  Stacy [name] is a 55 y.o. female with a PMHx of anxiety, depression, HTN, and HLD who presents to the Emergency Department complaining of a behavior problem onset tonight. She notes she has been drinking alcohol "at times." Pt denies taking any medication.   Per brother-in-law, pt was in rehab for alcoholism at [name] where she was kicked out 2 days ago. He notes pt is currently mentally "out of it" but was normal for the past 4-5 days. He states pt is currently "psychotic:" tapping her hands, sudden activity surges, and c/o seeing colors. He notes pt's sister has a PMHx of bipolar. He denies pt having SI or HI, but notes she has been pulling her hair, scratching herself, and squeezing her hands. He believes pt has taken some sort of medication, most likely Ambien, possibly her sister's bipolar medication.   Level V caveat as patient is unable to provide history.  Past Medical History  Diagnosis Date  . Hypertension   . Anxiety   . Depression   . Unspecified hypothyroidism   . Insomnia   . HSV (herpes simplex virus) infection   . Allergy   . Hyperlipidemia    Past Surgical History  Procedure Laterality Date  . Cosmetic surgery      breast implants saline  . Colposcopy     Family History  Problem Relation Age of Onset  . Stroke Mother   . Leukemia Father   . Depression Sister   . Depression Sister   . Depression Sister   . Depression Sister   . Heart disease Other   . Hyperlipidemia  Other   . Diabetes Other    Social History  Substance Use Topics  . Smoking status: Former Smoker -- 1.00 packs/day    Quit date: 06/08/2006  . Smokeless tobacco: None  . Alcohol Use: 1.8 oz/week    3 Glasses of wine per week   OB History    No data available     Review of Systems  Unable to perform ROS: Mental status change    Allergies  Ambien  Home Medications   Prior to Admission medications   Medication Sig Start Date End Date Taking? Authorizing Provider  acetaminophen (TYLENOL) 500 MG tablet Take 1,000 mg by mouth every 6 (six) hours as needed for moderate pain or headache.    Historical Provider, MD  acyclovir (ZOVIRAX) 200 MG capsule Take 1 capsule (200 mg total) by mouth 2 (two) times daily. 09/26/14   Quentin Mulling, PA-C  diazepam (VALIUM) 5 MG tablet Take 1 tablet (5 mg total) by mouth at bedtime and may repeat dose one time if needed. 04/22/15 04/21/16  Quentin Mulling, PA-C  escitalopram (LEXAPRO) 20 MG tablet Take 1 tablet (20 mg total) by mouth daily. 04/02/15 04/02/17  Quentin Mulling, PA-C  Eszopiclone 3 MG TABS Take 3 mg by mouth at bedtime as needed (sleep).  06/18/15   Historical Provider, MD  levothyroxine (SYNTHROID, LEVOTHROID) 88 MCG tablet  Take 1 tablet (88 mcg total) by mouth daily. 04/02/15   Quentin Mulling, PA-C  meloxicam (MOBIC) 15 MG tablet Take 15 mg by mouth daily. 06/05/15   Historical Provider, MD  ondansetron (ZOFRAN) 4 MG tablet Take 1 tablet (4 mg total) by mouth daily as needed for nausea or vomiting. 04/22/15 04/21/16  Quentin Mulling, PA-C  QUEtiapine (SEROQUEL) 100 MG tablet Take 1 tablet (100 mg total) by mouth at bedtime. 04/02/15   Quentin Mulling, PA-C  QUEtiapine (SEROQUEL) 25 MG tablet Take 25 mg by mouth at bedtime. 06/29/15   Historical Provider, MD   BP 118/85 mmHg  Pulse 82  Temp(Src) 97.7 F (36.5 C) (Oral)  Resp 18  SpO2 97% Physical Exam  Constitutional: She is oriented to person, place, and time.  Agitated, restless   HENT:  Head: Normocephalic and atraumatic.  Mucus membranes dry  Cardiovascular: Normal rate and regular rhythm.   Pulmonary/Chest: Effort normal. No respiratory distress.  Even respirations, unlabored  Neurological: She is alert and oriented to person, place, and time.  Skin: Skin is warm and dry.  Excoriations bilateral upper extremities  Psychiatric:  Bizarre behavior, flight of ideas, difficult to redirect, appears to be seeing things that are not there  Nursing note and vitals reviewed.   ED Course  Procedures  DIAGNOSTIC STUDIES: Oxygen Saturation is 99% on RA, normal by my interpretation.    COORDINATION OF CARE: 11:50 PM - Discussed plans to order medication to calm the patient and to ready IVC paperwork. Pt advised of plan for treatment and pt agrees.  12:18 AM - Re-checked pt after medication has taken effect. Pt is now calm. She notes she took 2 blue gel tablets of unknown medication for HA prior to onset of the behavioral issues. She notes a PMHx of similar psychotic episodes, but does not recall when they occurred. Pt advised of plan for treatment and pt agrees.   Labs Review Labs Reviewed  ACETAMINOPHEN LEVEL - Abnormal; Notable for the following:    Acetaminophen (Tylenol), Serum <10 (*)    All other components within normal limits  CBC WITH DIFFERENTIAL/PLATELET - Abnormal; Notable for the following:    WBC 11.2 (*)    Monocytes Absolute 1.2 (*)    All other components within normal limits  COMPREHENSIVE METABOLIC PANEL - Abnormal; Notable for the following:    CO2 17 (*)    Glucose, Bld 107 (*)    Calcium 10.5 (*)    ALT 13 (*)    Anion gap 19 (*)    All other components within normal limits  URINALYSIS, ROUTINE W REFLEX MICROSCOPIC (NOT AT [facility]) - Abnormal; Notable for the following:    APPearance TURBID (*)    Hgb urine dipstick TRACE (*)    Bilirubin Urine SMALL (*)    Ketones, ur 40 (*)    Protein, ur 100 (*)    Leukocytes, UA MODERATE (*)    All  other components within normal limits  URINE MICROSCOPIC-ADD ON - Abnormal; Notable for the following:    Squamous Epithelial / LPF TOO NUMEROUS TO COUNT (*)    Bacteria, UA MANY (*)    Casts HYALINE CASTS (*)    All other components within normal limits  BASIC METABOLIC PANEL - Abnormal; Notable for the following:    Chloride 99 (*)    CO2 19 (*)    Anion gap 17 (*)    All other components within normal limits  URINE CULTURE  URINE RAPID DRUG  SCREEN, HOSP PERFORMED  ETHANOL  SALICYLATE LEVEL  OSMOLALITY  ETHYLENE GLYCOL    Imaging Review No results found. I have personally reviewed and evaluated these images and lab results as part of my medical decision-making.   EKG Interpretation None      MDM   Final diagnoses:  None    Patient presents acutely psychotic. Initially picking at things very tangential, flight of ideas. Unknown whether she has ingested anything. History of alcohol abuse. Patient was IVC'd for her safety. She was given Geodon. She almost immediately improved. On recheck , She is somewhat redirectable. Sequence of events is still somewhat unclear.  She denies any ingestion or self-harm. She denies any recent alcohol use. She does not have a clear recollection of the last 48 hours. From that point for she rested comfortably. Lab work initially only notable for an anion gap.  She was mildly hypochloremic and bicarbonate was initially 17. She denied ingestion. Aspirin level is negative. Osmolar gap is 7. Ethylene glycol level is pending; however, have low suspicion at this time for toxic alcohol ingestion as her anion gap improved to 17 and she had an house more gap less than 10. She does have ketones in her urine and suspect that the patient is likely dry. She also has moderate stool leukocytes and many bacteria. She's currently asymptomatic and the urine specimen appears dirty. Urine culture was sent.  5:51 AM On recheck, patient is still very difficult to  interview. She is calm and cooperative. She appears to perseverate. She does not have a clear idea of events. 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  TTS pending  I personally performed the services described in this documentation, which was scribed in my presence. The recorded information has been reviewed and is accurate.   Stacy Baton, MD 07/09/15 [phone]

## 2015-07-08 NOTE — ED Notes (Addendum)
Dr Wilkie Aye in room with pt. Pt talking very fast with flight of ideas. Pt alert and oriented to self. Pt attempting to stand on her head in the bed and laying her head on this RN's chest, constantly asking random questions.

## 2015-07-09 DIAGNOSIS — F419 Anxiety disorder, unspecified: Secondary | ICD-10-CM

## 2015-07-09 DIAGNOSIS — R410 Disorientation, unspecified: Secondary | ICD-10-CM

## 2015-07-09 LAB — ETHANOL

## 2015-07-09 LAB — COMPREHENSIVE METABOLIC PANEL
ALK PHOS: 49 U/L (ref 38–126)
ALT: 13 U/L — ABNORMAL LOW (ref 14–54)
ANION GAP: 19 — AB (ref 5–15)
AST: 29 U/L (ref 15–41)
Albumin: 5 g/dL (ref 3.5–5.0)
BILIRUBIN TOTAL: 1 mg/dL (ref 0.3–1.2)
BUN: 11 mg/dL (ref 6–20)
CALCIUM: 10.5 mg/dL — AB (ref 8.9–10.3)
CO2: 17 mmol/L — ABNORMAL LOW (ref 22–32)
CREATININE: 1 mg/dL (ref 0.44–1.00)
Chloride: 101 mmol/L (ref 101–111)
Glucose, Bld: 107 mg/dL — ABNORMAL HIGH (ref 65–99)
Potassium: 4.1 mmol/L (ref 3.5–5.1)
Sodium: 137 mmol/L (ref 135–145)
TOTAL PROTEIN: 7.2 g/dL (ref 6.5–8.1)

## 2015-07-09 LAB — URINE MICROSCOPIC-ADD ON

## 2015-07-09 LAB — BASIC METABOLIC PANEL
ANION GAP: 17 — AB (ref 5–15)
BUN: 11 mg/dL (ref 6–20)
CALCIUM: 10.1 mg/dL (ref 8.9–10.3)
CO2: 19 mmol/L — AB (ref 22–32)
Chloride: 99 mmol/L — ABNORMAL LOW (ref 101–111)
Creatinine, Ser: 0.83 mg/dL (ref 0.44–1.00)
GLUCOSE: 79 mg/dL (ref 65–99)
POTASSIUM: 4.3 mmol/L (ref 3.5–5.1)
Sodium: 135 mmol/L (ref 135–145)

## 2015-07-09 LAB — CBC WITH DIFFERENTIAL/PLATELET
Basophils Absolute: 0.1 10*3/uL (ref 0.0–0.1)
Basophils Relative: 1 %
EOS ABS: 0.1 10*3/uL (ref 0.0–0.7)
Eosinophils Relative: 1 %
HEMATOCRIT: 39 % (ref 36.0–46.0)
HEMOGLOBIN: 13.5 g/dL (ref 12.0–15.0)
Lymphocytes Relative: 31 %
Lymphs Abs: 3.5 10*3/uL (ref 0.7–4.0)
MCH: 32.8 pg (ref 26.0–34.0)
MCHC: 34.6 g/dL (ref 30.0–36.0)
MCV: 94.9 fL (ref 78.0–100.0)
MONO ABS: 1.2 10*3/uL — AB (ref 0.1–1.0)
MONOS PCT: 11 %
NEUTROS PCT: 56 %
Neutro Abs: 6.3 10*3/uL (ref 1.7–7.7)
Platelets: 221 10*3/uL (ref 150–400)
RBC: 4.11 MIL/uL (ref 3.87–5.11)
RDW: 12.5 % (ref 11.5–15.5)
WBC: 11.2 10*3/uL — ABNORMAL HIGH (ref 4.0–10.5)

## 2015-07-09 LAB — URINALYSIS, ROUTINE W REFLEX MICROSCOPIC
BILIRUBIN URINE: NEGATIVE
Glucose, UA: NEGATIVE mg/dL
Glucose, UA: NEGATIVE mg/dL
Hgb urine dipstick: NEGATIVE
KETONES UR: 40 mg/dL — AB
LEUKOCYTES UA: NEGATIVE
NITRITE: NEGATIVE
NITRITE: NEGATIVE
PH: 5 (ref 5.0–8.0)
PROTEIN: 100 mg/dL — AB
Protein, ur: NEGATIVE mg/dL
Specific Gravity, Urine: 1.015 (ref 1.005–1.030)
Specific Gravity, Urine: 1.008 (ref 1.005–1.030)
pH: 5 (ref 5.0–8.0)

## 2015-07-09 LAB — ETHYLENE GLYCOL: Ethylene Glycol Lvl: NOT DETECTED mg/dL

## 2015-07-09 LAB — TSH: TSH: 0.381 u[IU]/mL (ref 0.350–4.500)

## 2015-07-09 LAB — RAPID URINE DRUG SCREEN, HOSP PERFORMED
AMPHETAMINES: NOT DETECTED
Barbiturates: NOT DETECTED
Benzodiazepines: NOT DETECTED
Cocaine: NOT DETECTED
Opiates: NOT DETECTED
Tetrahydrocannabinol: NOT DETECTED

## 2015-07-09 LAB — ACETAMINOPHEN LEVEL

## 2015-07-09 LAB — SALICYLATE LEVEL

## 2015-07-09 LAB — OSMOLALITY: OSMOLALITY: 277 mosm/kg (ref 275–295)

## 2015-07-09 MED ORDER — CEPHALEXIN 500 MG PO CAPS: 500.0000 mg | ORAL_CAPSULE | Freq: Two times a day (BID) | ORAL | Status: DC

## 2015-07-09 NOTE — ED Notes (Signed)
Dr Rubin Payor rescinded pt's IVC - original placed in envelope for magistrate, copy faxed to [employer] and copy sent to medical records by Elnora Morrison, RN.

## 2015-07-09 NOTE — ED Provider Notes (Signed)
  Physical Exam  BP 142/91 mmHg  Pulse 88  Temp(Src) 98.2 F (36.8 C) (Oral)  Resp 18  SpO2 100%  Physical Exam  ED Course  Procedures  MDM  patient's been seen by psychiatry and cleared. IVC is been reversed. Doubt toxic alcohol ingestion. Patient is back and appropriate. Also found if UTI. Will discharge home.      Benjiman Core, MD 07/09/15 1225

## 2015-07-09 NOTE — ED Notes (Signed)
Pt states "I am an alcoholic and have been under treatment [facility]"

## 2015-07-09 NOTE — ED Notes (Signed)
Advised MD of patient's Dispo with BH states he will come examine patient then D/C home.

## 2015-07-09 NOTE — ED Notes (Signed)
Per Meghan, [facility] - will discuss tx plan w/Ashley, [facility] SW, prior for pt to being d/c'd.

## 2015-07-09 NOTE — ED Notes (Signed)
Telepsych being performed. 

## 2015-07-09 NOTE — ED Notes (Signed)
TTS in process 

## 2015-07-09 NOTE — ED Notes (Signed)
Call with updates and questions:   Alver Fisher (Brother-in-law) [phone]   Jena Gauss (sister) [phone]

## 2015-07-09 NOTE — ED Notes (Signed)
Pt requested to have a female in the room with her to give urine specimen.

## 2015-07-09 NOTE — ED Notes (Signed)
Lab called to notify sent Ethylene Glycol to send out lab and was notified their machine is down currently - unsure when will receive results.

## 2015-07-09 NOTE — ED Notes (Addendum)
TTS machine at bedside. 

## 2015-07-09 NOTE — Progress Notes (Signed)
Pt was evaluated by psych NP this morning. Per NP, pt expressed consent to contact her sister [name] at [phone] for collateral information.  Sister states pt has no hx known of psychosis or hallucinations other than on her d/c MD note from [facility] stating that pt is "allergic to Ambien and has hallucinations when taking it." Sister states that pt lives with mother and their other sister "who has lots of psychiatric illness and is on lots of medications." States that she and her husband (pt's brother-in-law) went to visit yesterday and pt was acting erratically, "I've never seen [name] like that before, she was not making sense, talking fast but not coherently, trying to explain why she got kicked out of [facility]." States that sister "had to sit on her to keep her still, that was how hyperactive she was. It was like she didn't know what was happening at all." States they decided to take her to the ED as this was atypical symptomatology for pt. Sister states, "We can only speculate what happened at this point- but we think it's likely that, when [name] came home from [facility] Sunday and couldn't sleep, our sister gave her some of her own Ambien and it made her temporarily psychotic." Sister asked if pt could "be tested to see if Ambien is in her system." Expresses concern about "where she will go to live when she is discharged," became tearful and requested she be contacted prior to pt's discharge when cleared.  Discussed above collateral information with NP who evaluated pt this morning and with ED CSW. Will continue following.  Ilean Skill, MSW, LCSW Clinical Social Work, Disposition  07/09/2015 [phone]

## 2015-07-09 NOTE — ED Notes (Signed)
States last ETOH was 04/2015. States has been staying in places where there are other people who will help her to refrain from ETOH. Pt noted w/superficial abrasions to bil forearms - no bleeding noted. States occurred during disagreement w/sister last evening. States they were not trying to hurt each other. States her sister was trying to help her. Denies SI/HI. Aware waiting for Telepsych.

## 2015-07-09 NOTE — ED Notes (Signed)
Called staffing office and they indicated that no sitters are available at this time but if an extra became available that they would send one down.

## 2015-07-09 NOTE — ED Notes (Signed)
Sister has arrived and is talking w/Ashley, SW.

## 2015-07-09 NOTE — ED Notes (Signed)
Spoke w/pt and sister for extended length of time re: possible causes for pt's behavior. Sister asking if Ambien could be detected in UDS - spoke w/pharmacist who advised no d/t has very short half-life and is non-benzo hypnotic. Pt and sister concerned d/t half-way house will not accept pt back. Pt states she did find a blue pill in her closet that another resident also saw. States she took the pill to the kitchen and flushed it down the sink - denies taking it. States she advised one of the "T.A.'s" there about this. Sister and pt state the behavior pt displayed has not ever occurred before. Sister advised she plans to stay w/pt and assist her w/locating a new rehab facility d/t pt states she is unable to stay alone d/t fear of may drink ETOH.

## 2015-07-09 NOTE — Consult Note (Signed)
Telepsych Consultation   Reason for Consult: Altered Mental Status Referring Physician: Zacarias Pontes EDP Patient Identification: Stacy [name] MRN:  [id number] Principal Diagnosis: Anxiety Diagnosis:   Patient Active Problem List   Diagnosis Date Noted  . Acute confusion [R41.0] 07/09/2015  . Alcohol abuse [F10.10] 04/22/2015  . Abnormal glucose [R73.09] 01/15/2015  . Vitamin D deficiency [E55.9] 01/15/2015  . Noncompliance [Z91.19] 10/29/2014  . Hypothyroidism [E03.9] 06/27/2014  . Encounter for long-term (current) use of medications [Z79.899] 06/27/2014  . 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 Noncompliance with medication treatment due to overuse of medication [Z91.14] 09/06/2013  . Hypertension [I10]   . Anxiety [F41.9]   . Depression [F32.9]   . Insomnia [G47.00]   . Hyperlipidemia [E78.5]     Total Time spent with patient: 45 minutes  Subjective:   Stacy [name] is a 55 y.o. female patient admitted with reports of bizarre behaviors according to her family members. She was IVC'D based on this information and for her initial presentation in the ED which included standing on her head in the bed plus lying her head on the RN's chest. Patient states "I was not drinking alcohol. I was in a sober living house and was taken away. I don't know why. I have been trying to get better. That is all I want. I want to go back to [facility] to keep working on my sobriety. I feel good today. I'm really not sure what happened. I was role playing with my family and I guess that I got out of control. I am not suicidal. I was doing fine before I was asked to leave treatment. I was say my mood is good. I just wanted to figure out what happened because I don't know."  HPI:    Stacy [name] is a 55 year old female with a history of alcohol abuse and depression. She was brought in for bizarre behaviors by her family. Patient is unable to provide a clear reason for her  actions and appears under control today during assessment. She denies any recent use of alcohol or that she has used any unknown substances. Patient appears very future oriented talking about wanting to recover from her alcohol addiction and secure her job. The patient becomes tearful when speaking about this. Patient is alert and oriented during the assessment. There were no abnormal behaviors or thought processes noted. Patient reports taking her medications as ordered. Denies that she may have taken any extra doses of her synthroid, which could have induced the manic symptoms. Patient appears very concerned over her recent actions but is unable to provide an explanation. Patient expresses motivation to continue the treatment for her "disease of alcohol." Collateral information was obtained from patient's sister who reports that the patient has been staying with another sister who has mental health issues.  She expressed concern that the patient may have taken some Ambien to which she is allergic to. However, patient denied this during assessment but may explain her symptom presentation.   HPI Elements:   Location:  behavior changes, history of alcohol use . Quality:  Brought in by family due to acting strange. . Severity:  Moderate . Timing:  Last few days. Duration:  Acute. Context:  Recent tx for alcohol abuse, left [facility] for unclear reasons .  Past Medical History:  Past Medical History  Diagnosis Date  . Hypertension   . Anxiety   . Depression   . Unspecified hypothyroidism   . Insomnia   .  HSV (herpes simplex virus) infection   . Allergy   . Hyperlipidemia     Past Surgical History  Procedure Laterality Date  . Cosmetic surgery      breast implants saline  . Colposcopy     Family History:  Family History  Problem Relation Age of Onset  . Stroke Mother   . Leukemia Father   . Depression Sister   . Depression Sister   . Depression Sister   . Depression Sister   .  Heart disease Other   . Hyperlipidemia Other   . Diabetes Other    Social History:  History  Alcohol Use  . 1.8 oz/week  . 3 Glasses of wine per week     History  Drug Use No    Social History   Social History  . Marital Status: Divorced    Spouse Name: N/A  . Number of Children: N/A  . Years of Education: N/A   Social History Main Topics  . Smoking status: Former Smoker -- 1.00 packs/day    Quit date: 06/08/2006  . Smokeless tobacco: None  . Alcohol Use: 1.8 oz/week    3 Glasses of wine per week  . Drug Use: No  . Sexual Activity: Not Asked   Other Topics Concern  . None   Social History Narrative   Additional Social History:    Prescriptions: See PTA list History of alcohol / drug use?: Yes Name of Substance 1: Alcohol 1 - Age of First Use: teen 1 - Amount (size/oz): 2 bottles of wine 1 - Frequency: daily 1 - Duration: 1+ years 1 - Last Use / Amount: 05/03/2015 Name of Substance 2: Nicotine/Cigarettes 2 - Age of First Use: 20s 2 - Amount (size/oz): 1 pack 2 - Frequency: daily 2 - Last Use / Amount: 06/08/2006                 Allergies:   Allergies  Allergen Reactions  . Ambien [Zolpidem Tartrate] Other (See Comments)    hallucinations    Labs:  Results for orders placed or performed during the hospital encounter of 07/08/15 (from the past 48 hour(s))  Ethanol     Status: None   Collection Time: 07/09/15 12:01 AM  Result Value Ref Range   Alcohol, Ethyl (B) <5 <5 mg/dL    Comment:        LOWEST DETECTABLE LIMIT FOR SERUM ALCOHOL IS 5 mg/dL FOR MEDICAL PURPOSES ONLY   Acetaminophen level     Status: Abnormal   Collection Time: 07/09/15 12:01 AM  Result Value Ref Range   Acetaminophen (Tylenol), Serum <10 (L) 10 - 30 ug/mL    Comment:        THERAPEUTIC CONCENTRATIONS VARY SIGNIFICANTLY. A RANGE OF 10-30 ug/mL MAY BE AN EFFECTIVE CONCENTRATION FOR MANY PATIENTS. HOWEVER, SOME ARE BEST TREATED AT CONCENTRATIONS OUTSIDE  THIS RANGE. ACETAMINOPHEN CONCENTRATIONS >150 ug/mL AT 4 HOURS AFTER INGESTION AND >50 ug/mL AT 12 HOURS AFTER INGESTION ARE OFTEN ASSOCIATED WITH TOXIC REACTIONS.   Salicylate level     Status: None   Collection Time: 07/09/15 12:01 AM  Result Value Ref Range   Salicylate Lvl <4.0 2.8 - 30.0 mg/dL  CBC with Differential     Status: Abnormal   Collection Time: 07/09/15 12:01 AM  Result Value Ref Range   WBC 11.2 (H) 4.0 - 10.5 K/uL   RBC 4.11 3.87 - 5.11 MIL/uL   Hemoglobin 13.5 12.0 - 15.0 g/dL   HCT 25.9 56.3 -  46.0 %   MCV 94.9 78.0 - 100.0 fL   MCH 32.8 26.0 - 34.0 pg   MCHC 34.6 30.0 - 36.0 g/dL   RDW 12.5 11.5 - 15.5 %   Platelets 221 150 - 400 K/uL   Neutrophils Relative % 56 %   Neutro Abs 6.3 1.7 - 7.7 K/uL   Lymphocytes Relative 31 %   Lymphs Abs 3.5 0.7 - 4.0 K/uL   Monocytes Relative 11 %   Monocytes Absolute 1.2 (H) 0.1 - 1.0 K/uL   Eosinophils Relative 1 %   Eosinophils Absolute 0.1 0.0 - 0.7 K/uL   Basophils Relative 1 %   Basophils Absolute 0.1 0.0 - 0.1 K/uL  Comprehensive metabolic panel     Status: Abnormal   Collection Time: 07/09/15 12:01 AM  Result Value Ref Range   Sodium 137 135 - 145 mmol/L   Potassium 4.1 3.5 - 5.1 mmol/L   Chloride 101 101 - 111 mmol/L   CO2 17 (L) 22 - 32 mmol/L   Glucose, Bld 107 (H) 65 - 99 mg/dL   BUN 11 6 - 20 mg/dL   Creatinine, Ser 1.00 0.44 - 1.00 mg/dL   Calcium 10.5 (H) 8.9 - 10.3 mg/dL   Total Protein 7.2 6.5 - 8.1 g/dL   Albumin 5.0 3.5 - 5.0 g/dL   AST 29 15 - 41 U/L   ALT 13 (L) 14 - 54 U/L   Alkaline Phosphatase 49 38 - 126 U/L   Total Bilirubin 1.0 0.3 - 1.2 mg/dL   GFR calc non Af Amer >60 >60 mL/min   GFR calc Af Amer >60 >60 mL/min    Comment: (NOTE) The eGFR has been calculated using the CKD EPI equation. This calculation has not been validated in all clinical situations. eGFR's persistently <60 mL/min signify possible Chronic Kidney Disease.    Anion gap 19 (H) 5 - 15  Urine rapid drug screen  (hosp performed)     Status: None   Collection Time: 07/09/15  2:05 AM  Result Value Ref Range   Opiates NONE DETECTED NONE DETECTED   Cocaine NONE DETECTED NONE DETECTED   Benzodiazepines NONE DETECTED NONE DETECTED   Amphetamines NONE DETECTED NONE DETECTED   Tetrahydrocannabinol NONE DETECTED NONE DETECTED   Barbiturates NONE DETECTED NONE DETECTED    Comment:        DRUG SCREEN FOR MEDICAL PURPOSES ONLY.  IF CONFIRMATION IS NEEDED FOR ANY PURPOSE, NOTIFY LAB WITHIN 5 DAYS.        LOWEST DETECTABLE LIMITS FOR URINE DRUG SCREEN Drug Class       Cutoff (ng/mL) Amphetamine      1000 Barbiturate      200 Benzodiazepine   254 Tricyclics       270 Opiates          300 Cocaine          300 THC              50   Urinalysis, Routine w reflex microscopic (not at [facility])     Status: Abnormal   Collection Time: 07/09/15  2:05 AM  Result Value Ref Range   Color, Urine YELLOW YELLOW   APPearance TURBID (A) CLEAR   Specific Gravity, Urine 1.015 1.005 - 1.030   pH 5.0 5.0 - 8.0   Glucose, UA NEGATIVE NEGATIVE mg/dL   Hgb urine dipstick TRACE (A) NEGATIVE   Bilirubin Urine SMALL (A) NEGATIVE   Ketones, ur 40 (A) NEGATIVE mg/dL  Protein, ur 100 (A) NEGATIVE mg/dL   Nitrite NEGATIVE NEGATIVE   Leukocytes, UA MODERATE (A) NEGATIVE  Urine microscopic-add on     Status: Abnormal   Collection Time: 07/09/15  2:05 AM  Result Value Ref Range   Squamous Epithelial / LPF TOO NUMEROUS TO COUNT (A) NONE SEEN   WBC, UA 6-30 0 - 5 WBC/hpf   RBC / HPF 0-5 0 - 5 RBC/hpf   Bacteria, UA MANY (A) NONE SEEN   Casts HYALINE CASTS (A) NEGATIVE  Osmolality     Status: None   Collection Time: 07/09/15  2:17 AM  Result Value Ref Range   Osmolality 277 275 - 295 mOsm/kg  Basic metabolic panel     Status: Abnormal   Collection Time: 07/09/15  4:28 AM  Result Value Ref Range   Sodium 135 135 - 145 mmol/L   Potassium 4.3 3.5 - 5.1 mmol/L   Chloride 99 (L) 101 - 111 mmol/L   CO2 19 (L) 22 - 32 mmol/L    Glucose, Bld 79 65 - 99 mg/dL   BUN 11 6 - 20 mg/dL   Creatinine, Ser 2.19 0.44 - 1.00 mg/dL   Calcium 48.0 8.9 - 82.3 mg/dL   GFR calc non Af Amer >60 >60 mL/min   GFR calc Af Amer >60 >60 mL/min    Comment: (NOTE) The eGFR has been calculated using the CKD EPI equation. This calculation has not been validated in all clinical situations. eGFR's persistently <60 mL/min signify possible Chronic Kidney Disease.    Anion gap 17 (H) 5 - 15  Urinalysis, Routine w reflex microscopic     Status: Abnormal   Collection Time: 07/09/15  9:50 AM  Result Value Ref Range   Color, Urine YELLOW YELLOW   APPearance CLEAR CLEAR   Specific Gravity, Urine 1.008 1.005 - 1.030   pH 5.0 5.0 - 8.0   Glucose, UA NEGATIVE NEGATIVE mg/dL   Hgb urine dipstick NEGATIVE NEGATIVE   Bilirubin Urine NEGATIVE NEGATIVE   Ketones, ur >80 (A) NEGATIVE mg/dL   Protein, ur NEGATIVE NEGATIVE mg/dL   Nitrite NEGATIVE NEGATIVE   Leukocytes, UA NEGATIVE NEGATIVE    Comment: MICROSCOPIC NOT DONE ON URINES WITH NEGATIVE PROTEIN, BLOOD, LEUKOCYTES, NITRITE, OR GLUCOSE <1000 mg/dL.    Vitals: Blood pressure 115/98, pulse 84, temperature 97.9 F (36.6 C), temperature source Oral, resp. rate 16, SpO2 100 %.  Risk to Self: Suicidal Ideation: No (denies) Suicidal Intent: No Is patient at risk for suicide?: No Suicidal Plan?: No Access to Means: No (denies) What has been your use of drugs/alcohol within the last 12 months?: daily until 04/2015 How many times?: 0 Other Self Harm Risks: none Triggers for Past Attempts:  (na) Intentional Self Injurious Behavior: None (denies) Risk to Others: Homicidal Ideation: No (denies) Thoughts of Harm to Others: No (denies) Current Homicidal Intent: No Current Homicidal Plan: No Access to Homicidal Means: No (denies) Identified Victim: na History of harm to others?: No Assessment of Violence: None Noted Violent Behavior Description: na Does patient have access to weapons?: No  (denies) Criminal Charges Pending?: Yes (DUI pending) Does patient have a court date: Yes Court Date:  (unknown) Prior Inpatient Therapy: Prior Inpatient Therapy: No Prior Therapy Dates: na Prior Therapy Facilty/Provider(s): na Reason for Treatment: na Prior Outpatient Therapy: Prior Outpatient Therapy: Yes Prior Therapy Dates: Summer 2016 for a few weeks Prior Therapy Facilty/Provider(s): Dr. Mayford Knife Reason for Treatment: Alcohol Abuse, Anxiety Does patient have an ACCT team?: No Does  patient have Intensive In-House Services?  : No Does patient have [facility] services? : No Does patient have [facility] services?: No  No current facility-administered medications for this encounter.   Current Outpatient Prescriptions  Medication Sig Dispense Refill  . acetaminophen (TYLENOL) 500 MG tablet Take 1,000 mg by mouth every 6 (six) hours as needed for moderate pain or headache.    Marland Kitchen acyclovir (ZOVIRAX) 200 MG capsule Take 1 capsule (200 mg total) by mouth 2 (two) times daily. 60 capsule 3  . diazepam (VALIUM) 5 MG tablet Take 1 tablet (5 mg total) by mouth at bedtime and may repeat dose one time if needed. 15 tablet 0  . escitalopram (LEXAPRO) 20 MG tablet Take 1 tablet (20 mg total) by mouth daily. 30 tablet 2  . Eszopiclone 3 MG TABS Take 3 mg by mouth at bedtime as needed (sleep).   0  . levothyroxine (SYNTHROID, LEVOTHROID) 88 MCG tablet Take 1 tablet (88 mcg total) by mouth daily. 90 tablet 0  . meloxicam (MOBIC) 15 MG tablet Take 15 mg by mouth daily.  0  . ondansetron (ZOFRAN) 4 MG tablet Take 1 tablet (4 mg total) by mouth daily as needed for nausea or vomiting. 30 tablet 1  . QUEtiapine (SEROQUEL) 100 MG tablet Take 1 tablet (100 mg total) by mouth at bedtime. 30 tablet 1  . QUEtiapine (SEROQUEL) 25 MG tablet Take 25 mg by mouth at bedtime.      Musculoskeletal: Strength & Muscle Tone: Unable to assess on machine Gait & Station: Unable to assess on machine Patient leans: Unable to  assess on machine  Psychiatric Specialty Exam: Physical Exam  Review of Systems  Constitutional: Negative.   HENT: Negative.   Eyes: Negative.   Respiratory: Negative.   Cardiovascular: Negative.   Gastrointestinal: Negative.   Genitourinary: Negative.   Musculoskeletal: Negative.   Skin: Negative.   Neurological: Negative.   Endo/Heme/Allergies: Negative.   Psychiatric/Behavioral: Positive for depression (Stable). Negative for suicidal ideas, hallucinations, memory loss and substance abuse. The patient is nervous/anxious (Stable ). The patient does not have insomnia.     Blood pressure 115/98, pulse 84, temperature 97.9 F (36.6 C), temperature source Oral, resp. rate 16, SpO2 100 %.There is no weight on file to calculate BMI.  General Appearance: Casual  Eye Contact::  Good  Speech:  Clear and Coherent  Volume:  Normal  Mood:  Anxious  Affect:  Tearful  Thought Process:  Goal Directed and Intact  Orientation:  Full (Time, Place, and Person)  Thought Content:  Rumination  Suicidal Thoughts:  No  Homicidal Thoughts:  No  Memory:  Immediate;   Fair Recent;   Good Remote;   Good  Judgement:  Impaired  Insight:  Present  Psychomotor Activity:  Normal  Concentration:  Good  Recall:  [place] of Knowledge:Good  Language: Good  Akathisia:  No  Handed:  Right  AIMS (if indicated):     Assets:  Communication Skills Desire for Improvement Financial Resources/Insurance Housing Intimacy Leisure Time Physical Health Resilience Social Support Talents/Skills Vocational/Educational  ADL's:  Intact  Cognition: WNL  Sleep:      Medical Decision Making: Self-Limited or Minor (1), Review of Psycho-Social Stressors (1), Review or order clinical lab tests (1) and Review of Medication Regimen & Side Effects (2)  Plan:  No evidence of imminent risk to self or others at present.   Patient does not meet criteria for psychiatric inpatient admission. Supportive therapy provided  about ongoing  stressors. Discussed crisis plan, support from social network, calling 911, coming to the Emergency Department, and calling Suicide Hotline. Disposition:  Discharge to home  Return to the [facility] is symptoms return for further evaluation.  Elmarie Shiley, NP-C 07/09/2015 11:33 AM

## 2015-07-09 NOTE — ED Notes (Signed)
Security at bedside

## 2015-07-09 NOTE — Discharge Instructions (Signed)
Alcohol Use Disorder °Alcohol use disorder is a mental disorder. It is not a one-time incident of heavy drinking. Alcohol use disorder is the excessive and uncontrollable use of alcohol over time that leads to problems with functioning in one or more areas of daily living. People with this disorder risk harming themselves and others when they drink to excess. Alcohol use disorder also can cause other mental disorders, such as mood and anxiety disorders, and serious physical problems. People with alcohol use disorder often misuse other drugs.  °Alcohol use disorder is common and widespread. Some people with this disorder drink alcohol to cope with or escape from negative life events. Others drink to relieve chronic pain or symptoms of mental illness. People with a family history of alcohol use disorder are at higher risk of losing control and using alcohol to excess.  °Drinking too much alcohol can cause injury, accidents, and health problems. One drink can be too much when you are: °· Working. °· Pregnant or breastfeeding. °· Taking medicines. Ask your doctor. °· Driving or planning to drive. °SYMPTOMS  °Signs and symptoms of alcohol use disorder may include the following:  °· Consumption of alcohol in larger amounts or over a longer period of time than intended. °· Multiple unsuccessful attempts to cut down or control alcohol use.   °· A great deal of time spent obtaining alcohol, using alcohol, or recovering from the effects of alcohol (hangover). °· A strong desire or urge to use alcohol (cravings).   °· Continued use of alcohol despite problems at work, school, or home because of alcohol use.   °· Continued use of alcohol despite problems in relationships because of alcohol use. °· Continued use of alcohol in situations when it is physically hazardous, such as driving a car. °· Continued use of alcohol despite awareness of a physical or psychological problem that is likely related to alcohol use. Physical  problems related to alcohol use can involve the brain, heart, liver, stomach, and intestines. Psychological problems related to alcohol use include intoxication, depression, anxiety, psychosis, delirium, and dementia.   °· The need for increased amounts of alcohol to achieve the same desired effect, or a decreased effect from the consumption of the same amount of alcohol (tolerance). °· Withdrawal symptoms upon reducing or stopping alcohol use, or alcohol use to reduce or avoid withdrawal symptoms. Withdrawal symptoms include: °· Racing heart. °· Hand tremor. °· Difficulty sleeping. °· Nausea. °· Vomiting. °· Hallucinations. °· Restlessness. °· Seizures. °DIAGNOSIS °Alcohol use disorder is diagnosed through an assessment by your health care provider. Your health care provider may start by asking three or four questions to screen for excessive or problematic alcohol use. To confirm a diagnosis of alcohol use disorder, at least two symptoms must be present within a 12-month period. The severity of alcohol use disorder depends on the number of symptoms: °· Mild--two or three. °· Moderate--four or five. °· Severe--six or more. °Your health care provider may perform a physical exam or use results from lab tests to see if you have physical problems resulting from alcohol use. Your health care provider may refer you to a mental health professional for evaluation. °TREATMENT  °Some people with alcohol use disorder are able to reduce their alcohol use to low-risk levels. Some people with alcohol use disorder need to quit drinking alcohol. When necessary, mental health professionals with specialized training in substance use treatment can help. Your health care provider can help you decide how severe your alcohol use disorder is and what type of treatment you need.   The following forms of treatment are available:   Detoxification. Detoxification involves the use of prescription medicines to prevent alcohol withdrawal  symptoms in the first week after quitting. This is important for people with a history of symptoms of withdrawal and for heavy drinkers who are likely to have withdrawal symptoms. Alcohol withdrawal can be dangerous and, in severe cases, cause death. Detoxification is usually provided in a hospital or in-patient substance use treatment facility.  Counseling or talk therapy. Talk therapy is provided by substance use treatment counselors. It addresses the reasons people use alcohol and ways to keep them from drinking again. The goals of talk therapy are to help people with alcohol use disorder find healthy activities and ways to cope with life stress, to identify and avoid triggers for alcohol use, and to handle cravings, which can cause relapse.  Medicines.Different medicines can help treat alcohol use disorder through the following actions:  Decrease alcohol cravings.  Decrease the positive reward response felt from alcohol use.  Produce an uncomfortable physical reaction when alcohol is used (aversion therapy).  Support groups. Support groups are run by people who have quit drinking. They provide emotional support, advice, and guidance. These forms of treatment are often combined. Some people with alcohol use disorder benefit from intensive combination treatment provided by specialized substance use treatment centers. Both inpatient and outpatient treatment programs are available.   This information is not intended to replace advice given to you by your health care provider. Make sure you discuss any questions you have with your health care provider.   Document Released: 07/02/2004 Document Revised: 06/15/2014 Document Reviewed: 09/01/2012 Elsevier Interactive Patient Education 2016 Elsevier Inc.  Urinary Tract Infection Urinary tract infections (UTIs) can develop anywhere along your urinary tract. Your urinary tract is your body's drainage system for removing wastes and extra water. Your  urinary tract includes two kidneys, two ureters, a bladder, and a urethra. Your kidneys are a pair of bean-shaped organs. Each kidney is about the size of your fist. They are located below your ribs, one on each side of your spine. CAUSES Infections are caused by microbes, which are microscopic organisms, including fungi, viruses, and bacteria. These organisms are so small that they can only be seen through a microscope. Bacteria are the microbes that most commonly cause UTIs. SYMPTOMS  Symptoms of UTIs may vary by age and gender of the patient and by the location of the infection. Symptoms in young women typically include a frequent and intense urge to urinate and a painful, burning feeling in the bladder or urethra during urination. Older women and men are more likely to be tired, shaky, and weak and have muscle aches and abdominal pain. A fever may mean the infection is in your kidneys. Other symptoms of a kidney infection include pain in your back or sides below the ribs, nausea, and vomiting. DIAGNOSIS To diagnose a UTI, your caregiver will ask you about your symptoms. Your caregiver will also ask you to provide a urine sample. The urine sample will be tested for bacteria and white blood cells. White blood cells are made by your body to help fight infection. TREATMENT  Typically, UTIs can be treated with medication. Because most UTIs are caused by a bacterial infection, they usually can be treated with the use of antibiotics. The choice of antibiotic and length of treatment depend on your symptoms and the type of bacteria causing your infection. HOME CARE INSTRUCTIONS  If you were prescribed antibiotics, take them exactly as  your caregiver instructs you. Finish the medication even if you feel better after you have only taken some of the medication.  Drink enough water and fluids to keep your urine clear or pale yellow.  Avoid caffeine, tea, and carbonated beverages. They tend to irritate your  bladder.  Empty your bladder often. Avoid holding urine for long periods of time.  Empty your bladder before and after sexual intercourse.  After a bowel movement, women should cleanse from front to back. Use each tissue only once. SEEK MEDICAL CARE IF:   You have back pain.  You develop a fever.  Your symptoms do not begin to resolve within 3 days. SEEK IMMEDIATE MEDICAL CARE IF:   You have severe back pain or lower abdominal pain.  You develop chills.  You have nausea or vomiting.  You have continued burning or discomfort with urination. MAKE SURE YOU:   Understand these instructions.  Will watch your condition.  Will get help right away if you are not doing well or get worse.   This information is not intended to replace advice given to you by your health care provider. Make sure you discuss any questions you have with your health care provider.   Document Released: 03/04/2005 Document Revised: 02/13/2015 Document Reviewed: 07/03/2011 Elsevier Interactive Patient Education [employer]

## 2015-07-09 NOTE — ED Notes (Signed)
Patient given lunch tray.

## 2015-07-09 NOTE — ED Notes (Signed)
Original IVC paperwork placed in folder for Magistrate, copy sent to medial records, copy faxed to [facility].

## 2015-07-09 NOTE — ED Notes (Addendum)
Dr Wilkie Aye in room to speak with pt. When the pt was asked if she remembered coming in last night she said "Yes, I remember I was here and I kept asking people to come into the room.... I was riding with my brother and sister and law and I was acting out a scenario that I learned in rehab at [facility]" Pt still not making complete sense but is alert and oriented x 4. Pt to undergo TTS.

## 2015-07-09 NOTE — ED Notes (Addendum)
Horton MD at bedside.  

## 2015-07-09 NOTE — Progress Notes (Signed)
CSW engaged with Patient's sister via T/C who states that she is currently working with her sister to secure a plan for patient upon discharge. Patient's sister reports concerns with her sister having another episode in the event that the medication that sister took has not completely worn off. She reports that her sister has never had psychotic episodes and reports being very "shaken up" from the incident. CSW provided Patient's sister with emotional support. CSW's sister reports that she does not feel that her sister needs substance abuse placement but reports that Patient cannot live with her as she has a husband and two children and reports that she is concerned about her sister returning home with her mother due to another sister who lives there with mental illness with whom she believes gave Patient the medication.   Patient's sister reiterated that Patient is allergic to Ambien and develops hallucinations after taking it. She reports that she needs about 1 hour and she will give CSW a call back once she has something in place. CSW will call her back if T/C is not received by 1330. CSW will continue to follow for disposition.   [facility]oray[facility] ED/ [address] - [facility] Clinical Social Worker [phone]

## 2015-07-09 NOTE — ED Notes (Signed)
Pt now alert and calm. Pt alert and oriented x 4. Pt informed that we need urine specimen, pt requested water to drink and Dr Wilkie Aye stated that the pt could have water. Pt given water.
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
## 2015-07-10 LAB — URINE CULTURE

## 2015-07-15 ENCOUNTER — Emergency Department (HOSPITAL_COMMUNITY)
Admission: EM | Admit: 2015-07-15 | Discharge: 2015-07-15 | Disposition: A | Discharge status: Home / Self Care | Payer: BLUE CROSS/BLUE SHIELD | Attending: Emergency Medicine | Admitting: Emergency Medicine

## 2015-07-15 ENCOUNTER — Encounter (HOSPITAL_COMMUNITY): Payer: Self-pay | Admitting: Cardiology

## 2015-07-15 ENCOUNTER — Encounter (HOSPITAL_COMMUNITY): Payer: Self-pay | Admitting: Emergency Medicine

## 2015-07-15 DIAGNOSIS — I10 Essential (primary) hypertension: Secondary | ICD-10-CM | POA: Diagnosis not present

## 2015-07-15 DIAGNOSIS — F919 Conduct disorder, unspecified: Secondary | ICD-10-CM | POA: Insufficient documentation

## 2015-07-15 DIAGNOSIS — F329 Major depressive disorder, single episode, unspecified: Secondary | ICD-10-CM | POA: Diagnosis not present

## 2015-07-15 DIAGNOSIS — Z87891 Personal history of nicotine dependence: Secondary | ICD-10-CM | POA: Diagnosis not present

## 2015-07-15 DIAGNOSIS — Z8619 Personal history of other infectious and parasitic diseases: Secondary | ICD-10-CM | POA: Diagnosis not present

## 2015-07-15 DIAGNOSIS — R4182 Altered mental status, unspecified: Secondary | ICD-10-CM | POA: Diagnosis not present

## 2015-07-15 DIAGNOSIS — Z008 Encounter for other general examination: Secondary | ICD-10-CM | POA: Diagnosis present

## 2015-07-15 DIAGNOSIS — G4700 Insomnia, unspecified: Secondary | ICD-10-CM | POA: Insufficient documentation

## 2015-07-15 DIAGNOSIS — Z791 Long term (current) use of non-steroidal anti-inflammatories (NSAID): Secondary | ICD-10-CM | POA: Diagnosis not present

## 2015-07-15 DIAGNOSIS — E039 Hypothyroidism, unspecified: Secondary | ICD-10-CM | POA: Insufficient documentation

## 2015-07-15 DIAGNOSIS — F419 Anxiety disorder, unspecified: Secondary | ICD-10-CM | POA: Insufficient documentation

## 2015-07-15 DIAGNOSIS — Z792 Long term (current) use of antibiotics: Secondary | ICD-10-CM | POA: Insufficient documentation

## 2015-07-15 DIAGNOSIS — R4689 Other symptoms and signs involving appearance and behavior: Secondary | ICD-10-CM

## 2015-07-15 DIAGNOSIS — F912 Conduct disorder, adolescent-onset type: Secondary | ICD-10-CM | POA: Insufficient documentation

## 2015-07-15 DIAGNOSIS — Z79899 Other long term (current) drug therapy: Secondary | ICD-10-CM | POA: Diagnosis not present

## 2015-07-15 DIAGNOSIS — G478 Other sleep disorders: Secondary | ICD-10-CM | POA: Diagnosis not present

## 2015-07-15 DIAGNOSIS — G479 Sleep disorder, unspecified: Secondary | ICD-10-CM | POA: Diagnosis not present

## 2015-07-15 LAB — COMPREHENSIVE METABOLIC PANEL
ALK PHOS: 51 U/L (ref 38–126)
ALT: 14 U/L (ref 14–54)
ANION GAP: 10 (ref 5–15)
AST: 23 U/L (ref 15–41)
Albumin: 5.3 g/dL — ABNORMAL HIGH (ref 3.5–5.0)
BUN: 9 mg/dL (ref 6–20)
CALCIUM: 9.8 mg/dL (ref 8.9–10.3)
CO2: 25 mmol/L (ref 22–32)
Chloride: 100 mmol/L — ABNORMAL LOW (ref 101–111)
Creatinine, Ser: 0.61 mg/dL (ref 0.44–1.00)
Glucose, Bld: 98 mg/dL (ref 65–99)
Potassium: 4.2 mmol/L (ref 3.5–5.1)
SODIUM: 135 mmol/L (ref 135–145)
TOTAL PROTEIN: 7.8 g/dL (ref 6.5–8.1)
Total Bilirubin: 0.6 mg/dL (ref 0.3–1.2)

## 2015-07-15 LAB — URINE MICROSCOPIC-ADD ON: BACTERIA UA: NONE SEEN

## 2015-07-15 LAB — CBC
HCT: 37.2 % (ref 36.0–46.0)
HEMOGLOBIN: 12.7 g/dL (ref 12.0–15.0)
MCH: 32.6 pg (ref 26.0–34.0)
MCHC: 34.1 g/dL (ref 30.0–36.0)
MCV: 95.6 fL (ref 78.0–100.0)
PLATELETS: 209 10*3/uL (ref 150–400)
RBC: 3.89 MIL/uL (ref 3.87–5.11)
RDW: 12.5 % (ref 11.5–15.5)
WBC: 6.5 10*3/uL (ref 4.0–10.5)

## 2015-07-15 LAB — RAPID URINE DRUG SCREEN, HOSP PERFORMED
Amphetamines: NOT DETECTED
Barbiturates: NOT DETECTED
Benzodiazepines: NOT DETECTED
COCAINE: NOT DETECTED
OPIATES: NOT DETECTED
Tetrahydrocannabinol: NOT DETECTED

## 2015-07-15 LAB — URINALYSIS, ROUTINE W REFLEX MICROSCOPIC
BILIRUBIN URINE: NEGATIVE
GLUCOSE, UA: NEGATIVE mg/dL
HGB URINE DIPSTICK: NEGATIVE
KETONES UR: NEGATIVE mg/dL
Nitrite: NEGATIVE
PROTEIN: NEGATIVE mg/dL
Specific Gravity, Urine: 1.004 — ABNORMAL LOW (ref 1.005–1.030)
pH: 7 (ref 5.0–8.0)

## 2015-07-15 LAB — SALICYLATE LEVEL

## 2015-07-15 LAB — ETHANOL

## 2015-07-15 LAB — TSH: TSH: 1.292 u[IU]/mL (ref 0.350–4.500)

## 2015-07-15 LAB — ACETAMINOPHEN LEVEL

## 2015-07-15 NOTE — ED Notes (Signed)
Verbalized understanding discharge instructions. In no acute distress.  Pressured speech noted.  Also, Pt asking about receiving Tylenol x 2.  Pt informed that nothing further was ordered.  Pt sts "I know, she was going to give it to me out of the goodness of her heart."  When asked to clarify, the Pt sts "oh, never mind.  You're busy.  I understand."

## 2015-07-15 NOTE — BH Assessment (Signed)
Assessment completed. Attempted to call family in lobby x2 with no answer. Spoke with EDP who states that the patients family seemed concerned about her inability to focus and their ability to care for her needs.  Consulted with Nanine Means, DNP who states that patient does not currently meet inpatient criteria but would benefit from a CDIOP program. Provided information for CDIOP and informed EDP of disposition.   Davina Poke, LCSW Therapeutic Triage Specialist [facility] 07/15/2015 5:31 PM

## 2015-07-15 NOTE — BH Assessment (Addendum)
Assessment Note  Stacy [name] is an 55 y.o. female who presents to [facility] stating that she is present for a "psych eval." Patient states that she was here on 07/09/2015 for an evaluation and did not meet inpatient criteria. Patient states that she has an extensive history of drinking alcohol and went to [facility] in November 2016 and relapsed shortly after and went back a second time on May 03, 2015. Patient states that she went to [facility] after her release with is a halfway house. Patient states that she has been stressed and was released from "[facility]" and feels that she was released too soon and would like to return. Patient states that she still feels like she needs treatment. Patient states that she saw a "Psychologist" last summer who introduced her to AA. Patient states that she received several DUI's which led to her treatment at [facility]. Patient denies other psychiatric treatment. Patient reports that she was prescribed Lexapro by her PCP  and she has not been taking it recently. Patient states that she has not taken it "within the last several days" because she does not feel that she is depressed but does feel that her anxiety has increased. Patient denies SI and history of attempts and self injurious behaviors. Patient denies HI and history of aggression. Patient denies access to firearms or weapons. Patient states that she had court today for a DUI and she has "a couple" DUI's and she thinks that her next appointment is in April. Patient states that she has an attorney to deal with this. Patient states that she is interested in treatment but would like to continue to work. Patient denies AVH and does not appear to be responding to internal stimuli. Patient states that she has not drank since her release from [facility]. Patient denies use of illicit drugs. Patient blood alcohol level is <5 and urine drug screen was clear at time of assessment.   Patients is alert and  oriented x4. Patient appears anxious and her mood and affect are congruent. Patient is calm and cooperative and is tangential at times but is easily redirected.  Patient states that she has felt overwhelmed since getting out of treatment and has not followed up with anyone.   Patients sister Neomia Dear was outside of the room and requested to speak with this Clinical research associate.  Patients sister states that she has taken off of work to assist with her sister once she was discharged and she has to return to work soon. She states that she thinks that the patient needs to speak with a psychiatrist to get on medications because she has been more talkative and asks a lot of questions. The patients sister states that she depends on her for assistance and she would like medications to help her manage stress.   Consulted with Nanine Means, DNP who recommends patient be discharged and follow up with [facility]. Patient called and left a voicemail with [facility] requesting to make an appointment for an assessment along with additional resources for various local CD-IOP programs.     Diagnosis: Adjustment Disorder with Anxious Mood  Past Medical History:  Past Medical History  Diagnosis Date  . Hypertension   . Anxiety   . Depression   . Unspecified hypothyroidism   . Insomnia   . HSV (herpes simplex virus) infection   . Allergy   . Hyperlipidemia     Past Surgical History  Procedure Laterality Date  . Cosmetic surgery  breast implants saline  . Colposcopy      Family History:  Family History  Problem Relation Age of Onset  . Stroke Mother   . Leukemia Father   . Depression Sister   . Depression Sister   . Depression Sister   . Depression Sister   . Heart disease Other   . Hyperlipidemia Other   . Diabetes Other     Social History:  reports that she quit smoking about 9 years ago. She does not have any smokeless tobacco history on file. She reports that she drinks about 1.8 oz of alcohol per week. She  reports that she does not use illicit drugs.  Additional Social History:  Alcohol / Drug Use Pain Medications: See PTA Prescriptions: See PTA Over the Counter: See PTA Substance #1 Name of Substance 1: Alcohol 1 - Age of First Use: 16 1 - Amount (size/oz): 2 bottles of wine 1 - Frequency: daily 1 - Last Use / Amount: November  CIWA: CIWA-Ar BP: (!) 163/103 mmHg Pulse Rate: 82 COWS:    Allergies:  Allergies  Allergen Reactions  . Ambien [Zolpidem Tartrate] Other (See Comments)    hallucinations    Home Medications:  (Not in a hospital admission)  OB/GYN Status:  No LMP recorded. Patient is postmenopausal.  General Assessment Data [facility] of Assessment: [facility] TTS Assessment: In system Is this a Tele or Face-to-Face Assessment?: Tele Assessment Is this an Initial Assessment or a Re-assessment for this encounter?: Initial Assessment Marital status: Divorced [place] name: Claudio Is patient pregnant?: No Pregnancy Status: No Living Arrangements: Parent Can pt return to current living arrangement?: Yes Admission Status: Voluntary Is patient capable of signing voluntary admission?: No Referral Source: Self/Family/Friend Insurance type: [employer]     Crisis Care Plan Living Arrangements: Parent Name of Psychiatrist: None Name of Therapist: None  Education Status Is patient currently in school?: No Highest grade of school patient has completed: HS  Risk to self with the past 6 months Suicidal Ideation: No Has patient been a risk to self within the past 6 months prior to admission? : No Suicidal Intent: No Has patient had any suicidal intent within the past 6 months prior to admission? : No Is patient at risk for suicide?: No Suicidal Plan?: No Has patient had any suicidal plan within the past 6 months prior to admission? : No Access to Means: No What has been your use of drugs/alcohol within the last 12 months?: daily until November 2016 Previous  Attempts/Gestures: No How many times?: 0 Other Self Harm Risks: Scratched self at [employer] Triggers for Past Attempts: None known Intentional Self Injurious Behavior: None Family Suicide History: No Recent stressful life event(s): Other (Comment) (Leaving [facility] two weeks ago (sober living house)) Persecutory voices/beliefs?: No Depression: No Depression Symptoms:  (Denies) Substance abuse history and/or treatment for substance abuse?: Yes Suicide prevention information given to non-admitted patients: Not applicable  Risk to Others within the past 6 months Homicidal Ideation: No Does patient have any lifetime risk of violence toward others beyond the six months prior to admission? : No Thoughts of Harm to Others: No Current Homicidal Intent: No Current Homicidal Plan: No Access to Homicidal Means: No Identified Victim: Denies History of harm to others?: No Assessment of Violence: None Noted Violent Behavior Description: Denies Does patient have access to weapons?: No Criminal Charges Pending?: Yes Describe Pending Criminal Charges: "couple DUI" Does patient have a court date: Yes Court Date: 09/09/15 Is patient on probation?: No  Psychosis Hallucinations: None noted Delusions: None noted  Mental Status Report Appearance/Hygiene: In hospital gown Eye Contact: Good Motor Activity: Unable to assess Speech: Logical/coherent Level of Consciousness: Alert Mood: Anxious Affect: Anxious Anxiety Level: Moderate Thought Processes: Coherent, Relevant Judgement: Unimpaired Orientation: Person, Place, Time, Situation, Appropriate for developmental age Obsessive Compulsive Thoughts/Behaviors: None  Cognitive Functioning Concentration: Good Memory: Recent Intact, Remote Intact IQ: Average Insight: Fair Impulse Control: Fair Appetite: Good Sleep: Decreased Total Hours of Sleep: 3 Vegetative Symptoms: None  ADLScreening [facility] Assessment Services) Patient's cognitive  ability adequate to safely complete daily activities?: Yes Patient able to express need for assistance with ADLs?: Yes Independently performs ADLs?: Yes (appropriate for developmental age)  Prior Inpatient Therapy Prior Inpatient Therapy: No Prior Therapy Dates: N/A Prior Therapy Facilty/Provider(s): N/A Reason for Treatment: N/A  Prior Outpatient Therapy Prior Outpatient Therapy: Yes Prior Therapy Dates: Summer 2016 Prior Therapy Facilty/Provider(s): Dr. Mayford Knife Reason for Treatment: SA Does patient have an ACCT team?: No Does patient have Intensive In-House Services?  : No Does patient have [facility] services? : No Does patient have P4CC services?: No  ADL Screening (condition at time of admission) Patient's cognitive ability adequate to safely complete daily activities?: Yes Is the patient deaf or have difficulty hearing?: No Does the patient have difficulty seeing, even when wearing glasses/contacts?: No Does the patient have difficulty concentrating, remembering, or making decisions?: No Patient able to express need for assistance with ADLs?: Yes Does the patient have difficulty dressing or bathing?: No Independently performs ADLs?: Yes (appropriate for developmental age) Does the patient have difficulty walking or climbing stairs?: No Weakness of Legs: None Weakness of Arms/Hands: None  Home Assistive Devices/Equipment Home Assistive Devices/Equipment: None  Therapy Consults (therapy consults require a physician order) PT Evaluation Needed: No OT Evalulation Needed: No SLP Evaluation Needed: No Abuse/Neglect Assessment (Assessment to be complete while patient is alone) Physical Abuse: Denies Verbal Abuse: Denies Sexual Abuse: Denies Exploitation of patient/patient's resources: Denies Self-Neglect: Denies Values / Beliefs Cultural Requests During Hospitalization: None Spiritual Requests During Hospitalization: None Consults Spiritual Care Consult Needed:  No Social Work Consult Needed: No Merchant navy officer (For Healthcare) Does patient have an advance directive?: No Would patient like information on creating an advanced directive?: No - patient declined information    Additional Information 1:1 In Past 12 Months?: No CIRT Risk: No Elopement Risk: No     Disposition:  Disposition Initial Assessment Completed for this Encounter: Yes  On Site Evaluation by:   Reviewed with Physician:    Lyric Rossano 07/15/2015 6:24 PM

## 2015-07-15 NOTE — ED Notes (Signed)
Pt reports that she is a chronic ETOH user and was recently at [facility]. Was brought in by her family last week for erractic behavior and released. States the behavior has continued to get worse and has not been sleeping. Family is concerned that she is not acting herself. Pt denies any SI/HI.

## 2015-07-15 NOTE — ED Provider Notes (Signed)
CSN: [id number]     Arrival date & time 07/15/15  1355 History   First MD Initiated Contact with Patient 07/15/15 [phone]     Chief Complaint  Patient presents with  . Urinary Tract Infection  . Medical Clearance     (Consider location/radiation/quality/duration/timing/severity/associated sxs/prior Treatment) Patient is a 55 y.o. female presenting with urinary tract infection. The history is provided by the patient and medical records. No language interpreter was used.  Urinary Tract Infection  Stacy [name] is a 55 y.o. female  with a PMH of Anxiety/depression, hypothyroidism, HTN, HLD who presents to the Emergency Department with sister for change in mental status. Patient unable to give a clear history of events bringing her to ED. Pt. Was seen in ED on 1/31 for similar behavioral problems, and per sister, sxs have not improved, and possibly worsened since this time. Pt. Denies SI/HI, no alcohol in "a long time" - specifies not in the last 2 weeks. Was recently kicked out of [facility] and thoughts appear fixated on the facility and why they would have made her leave. She was given Keflex for UTI at discharge at last ER visit, however she has not been compliant with meds. She is unsure about her compliance with other medications as well. Admits to insomnia over the last two weeks. Admits to hearing sounds "beeps" that other people don't hear. Denies visual hallucinations. Currently living with her mother.   Level 5 caveat applies due to mental status.   Past Medical History  Diagnosis Date  . Hypertension   . Anxiety   . Depression   . Unspecified hypothyroidism   . Insomnia   . HSV (herpes simplex virus) infection   . Allergy   . Hyperlipidemia    Past Surgical History  Procedure Laterality Date  . Cosmetic surgery      breast implants saline  . Colposcopy     Family History  Problem Relation Age of Onset  . Stroke Mother   . Leukemia Father   . Depression Sister   .  Depression Sister   . Depression Sister   . Depression Sister   . Heart disease Other   . Hyperlipidemia Other   . Diabetes Other    Social History  Substance Use Topics  . Smoking status: Former Smoker -- 1.00 packs/day    Quit date: 06/08/2006  . Smokeless tobacco: None  . Alcohol Use: 1.8 oz/week    3 Glasses of wine per week   OB History    No data available     Review of Systems  Unable to perform ROS: Mental status change  Genitourinary: Negative for dysuria and frequency.  Psychiatric/Behavioral: Positive for sleep disturbance. Negative for suicidal ideas and self-injury.     Allergies  Ambien  Home Medications   Prior to Admission medications   Medication Sig Start Date End Date Taking? Authorizing Provider  acetaminophen (TYLENOL) 500 MG tablet Take 1,000 mg by mouth every 6 (six) hours as needed for moderate pain or headache.   Yes Historical Provider, MD  acyclovir (ZOVIRAX) 200 MG capsule Take 1 capsule (200 mg total) by mouth 2 (two) times daily. 09/26/14  Yes Quentin Mulling, PA-C  cephALEXin (KEFLEX) 500 MG capsule Take 1 capsule (500 mg total) by mouth 2 (two) times daily. 07/09/15  Yes Benjiman Core, MD  diphenhydrAMINE (BENADRYL) 25 MG tablet Take 50 mg by mouth every 6 (six) hours as needed.   Yes Historical Provider, MD  Diphenhydramine-APAP, sleep, (EXCEDRIN  PM PO) Take 1 tablet by mouth at bedtime as needed (sleep/pain).   Yes Historical Provider, MD  levothyroxine (SYNTHROID, LEVOTHROID) 88 MCG tablet Take 1 tablet (88 mcg total) by mouth daily. 04/02/15  Yes Quentin Mulling, PA-C  meloxicam (MOBIC) 15 MG tablet Take 15 mg by mouth daily. 06/05/15  Yes Historical Provider, MD  QUEtiapine (SEROQUEL) 25 MG tablet Take 25 mg by mouth at bedtime. 06/29/15  Yes Historical Provider, MD  diazepam (VALIUM) 5 MG tablet Take 1 tablet (5 mg total) by mouth at bedtime and may repeat dose one time if needed. 04/22/15 04/21/16  Quentin Mulling, PA-C  escitalopram  (LEXAPRO) 20 MG tablet Take 1 tablet (20 mg total) by mouth daily. Patient not taking: Reported on 07/15/2015 04/02/15 04/02/17  Quentin Mulling, PA-C  Eszopiclone 3 MG TABS Take 3 mg by mouth at bedtime as needed (sleep).  06/18/15   Historical Provider, MD  ondansetron (ZOFRAN) 4 MG tablet Take 1 tablet (4 mg total) by mouth daily as needed for nausea or vomiting. 04/22/15 04/21/16  Quentin Mulling, PA-C  QUEtiapine (SEROQUEL) 100 MG tablet Take 1 tablet (100 mg total) by mouth at bedtime. 04/02/15   Quentin Mulling, PA-C   BP 163/103 mmHg  Pulse 82  Temp(Src) 97.3 F (36.3 C) (Oral)  Resp 18  SpO2 100% Physical Exam  Constitutional: She appears well-developed and well-nourished.  Anxious. NAD.  HENT:  Head: Normocephalic and atraumatic.  Eyes: Conjunctivae and EOM are normal. Pupils are equal, round, and reactive to light.  Cardiovascular: Normal rate, regular rhythm and normal heart sounds.  Exam reveals no gallop and no friction rub.   No murmur heard. Pulmonary/Chest: Effort normal and breath sounds normal. No respiratory distress. She has no wheezes. She has no rales.  Abdominal: Soft. Bowel sounds are normal. She exhibits no distension and no mass. There is no tenderness. There is no rebound and no guarding.  Musculoskeletal: She exhibits no edema.  Neurological:  A&O x 3. + Flight of ideas, unable to give a coherent history. Speech is clear but not goal oriented. Thought fixated on her "training" and [facility]. Able to follow commands.  Skin: Skin is warm and dry. No rash noted.  Psychiatric: She has a normal mood and affect. Her behavior is normal. Judgment and thought content normal.  Nursing note and vitals reviewed.   ED Course  Procedures (including critical care time) Labs Review Labs Reviewed  URINALYSIS, ROUTINE W REFLEX MICROSCOPIC (NOT AT [facility]) - Abnormal; Notable for the following:    Specific Gravity, Urine 1.004 (*)    Leukocytes, UA TRACE (*)    All other  components within normal limits  COMPREHENSIVE METABOLIC PANEL - Abnormal; Notable for the following:    Chloride 100 (*)    Albumin 5.3 (*)    All other components within normal limits  ACETAMINOPHEN LEVEL - Abnormal; Notable for the following:    Acetaminophen (Tylenol), Serum <10 (*)    All other components within normal limits  URINE MICROSCOPIC-ADD ON - Abnormal; Notable for the following:    Squamous Epithelial / LPF 6-30 (*)    All other components within normal limits  URINE CULTURE  ETHANOL  SALICYLATE LEVEL  CBC  URINE RAPID DRUG SCREEN, HOSP PERFORMED  TSH    Imaging Review No results found. I have personally reviewed and evaluated these images and lab results as part of my medical decision-making.   EKG Interpretation None      MDM   Final  diagnoses:  Behavioral change   Stacy [name] presents with sister for worsening behavioral problems and recent dx of UTI with medication noncompliance. Cx from last visit reviewed that shows multiple species and repeat collection recommended - UA was repeated and does not appear infected - will send for cx. All other lab work reviewed and reassuring.   TTS consulted for behavioral problems and do not believe she meets inpatient criteria. Denies si/hi. Follow up will be arranged with the chemical dependency intensive outpatient program for discharge. Encouraged to take all medications as directed. Return precautions given.     [facility] Salene Mohamud, PA-C 07/15/15 1752  Arby Barrette, MD 07/22/15 [phone]

## 2015-07-15 NOTE — Discharge Instructions (Signed)
Follow up with the Chemical Dependency Intensive Outpatient Program as discussed.  Take all medications as prescribed.  Return to ER for any new or worsening symptoms, any additional concerns.

## 2015-07-15 NOTE — ED Notes (Signed)
Pt sister has brought the patient over here for not sleeping x 2 weeks, not eating/drinking like she should. Pt not acting like her normal self, sister states she thinks she's not taking her medications again. She was seen here a week ago and was diagnosed with a UTI, but her sister says she hasn't been taking her antibiotic for it either. Pt denies SI/HI

## 2015-07-15 NOTE — ED Notes (Signed)
Pt has pressured speech and is hard to redirect. Pt stated she has been seen at [facility] recently, but could not say when. Pt could also not report why she is here today

## 2015-07-15 NOTE — BH Assessment (Signed)
Spoke with patients sister Stacy [name] who states that her main concern is that she took off work to help her sister once she was discharged from [facility] and she has to return to work and would like to speak with a psychiatrist to get the patient on medication so that she would feel more comfortable going back to work. She states that she would like for the patient to be calmer. Patients sister states "she is calm and cooperating with everyone right now , but she can really drive someone up the wall." She states that the patient is asking questions about things and asking her to do things and she has become overwhelmed.  Informed patient that the recommendation is for her to be referred to [facility] and that she would speak with a PA about medication management as a part of that program. Provided patient with the number who states that she knows that her sister is overwhelmed with helping her and that she is overwhelmed as well and she wants to get help with adjusting to her life without alcohol.   Informed Stacy Means, DNP about ongoing concerns who continues patient be referred to [facility]. Provided patient with number to contact Charmian Muff. Patient and her sister contacted Charmian Muff, LCAS at [phone] and left a voicemail for her to call back tomorrow so that she is able to make an appointment.    Stacy Poke, LCSW Therapeutic Triage Specialist [facility] 07/15/2015 6:12 PM

## 2015-07-16 LAB — URINE CULTURE: Culture: NO GROWTH

## 2015-07-17 ENCOUNTER — Ambulatory Visit (INDEPENDENT_AMBULATORY_CARE_PROVIDER_SITE_OTHER): Payer: BLUE CROSS/BLUE SHIELD | Admitting: Psychiatry

## 2015-07-17 VITALS — BP 149/102 | HR 83 | Resp 12 | Ht 62.5 in | Wt 110.6 lb

## 2015-07-17 DIAGNOSIS — F1010 Alcohol abuse, uncomplicated: Secondary | ICD-10-CM

## 2015-07-17 NOTE — Progress Notes (Signed)
Psychiatric Initial Adult Assessment   Patient Identification: Stacy [name] MRN:  [id number] Date of Evaluation:  07/17/2015 Referral Source:Ann Evans Chief Complaint:  I am an alcoholic Visit Diagnos alcohol dependence Diagnosis:  Alcohol use disorder  Patient Active Problem List   Diagnosis Date Noted  . Acute confusion [R41.0] 07/09/2015  . Alcohol abuse [F10.10] 04/22/2015  . Abnormal glucose [R73.09] 01/15/2015  . Vitamin D deficiency [E55.9] 01/15/2015  . Noncompliance [Z91.19] 10/29/2014  . Hypothyroidism [E03.9] 06/27/2014  . Encounter for long-term (current) use of medications [Z79.899] 06/27/2014  . 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 Noncompliance with medication treatment due to overuse of medication [Z91.14] 09/06/2013  . Hypertension [I10]   . Anxiety [F41.9]   . Depression [F32.9]   . Insomnia [G47.00]   . Hyperlipidemia [E78.5]    History of Present Illness:  This patient is a 55 year old white female who is here to be evaluated for this CD IOP program. She essentially is here to be cleared to determine if she could benefit from being in the program and if she has any significant other psychiatric condition that might impair or packed her ability to be in this treatment setting. The patient has had a relatively complex history over the last few months involving 2 [facility] treatment. The patient has been in a psychiatric hospital here at one point this year and then again about a week ago formula was considered to be agitated behavior. The patient has been divorced for over 10 years. She has no children. The patient is a flight attendant for decades. She still is actively employed. Today the patient denies any depression. In a close evaluation she denies clear periods of euphoria or irritability. She did have some acting out aggressive behavior towards a particular sister because she's got a family conflict regarding her sister. But in general  the patient has little evidence of being overtly manic and in general way. She is sleeping fair but this is chronic. She does not have a decreased need for sleep she needs to sleep. Her appetite is good as is her energy. Her self-esteem is intact does not inflated she is not grandiose is normal. She denies a feeling of worthlessness. She's not suicidal now and never has been. The patient enjoys walking listening to TV and movies. Presently she is in no relationship. The patient knowledge is that she is alcohol dependent and has a number of DUIs or still court. She denies the use of any illicit drugs or marijuana cocaine or LSD. The patient denies any psychotic symptoms at this time or ever. Tonight she has AA meeting and plans to go. The patient denies ever having an episode of daily depression.she denies symptoms of generalized anxiety disorder panic disorder or obsessive-compulsive disorder. Her medical history significant for Graves' disease and presently is on thyroid replacement. According to her she had a recent thyroid level that was normal. The patient has had 2 psychiatric hospitalizations in the last 6 months. Previous to this she never been in a psychiatric hospital and she's never been evaluated by a psychiatrist. Apparently had some point while she was at [facility] a began her on Seroquel for unclear reasons. The patient suspects it was probably for sleep. The patient apparently was not aggressive at [facility] and seem to enjoy being there. The patient is very interested in going to the CD IOP program in the setting. In today's interview include talking about this patient with her sister Neomia Dear. Her sister did  not contribute anymore information the patient did. The patient is no clear history of any mood disorder. I can only attribute her aggressive and agitated behavior is related to things about her character and her particular anger works her sister. I believe there is likely control issues  and conflict in terms of her mother in her care. Elements:   Associated Signs/Symptoms: Depression Symptoms:   (Hypo) Manic Symptoms:  Impulsivity, Anxiety Symptoms:   Psychotic Symptoms:   PTSD Symptoms:   Past Medical History:  Past Medical History  Diagnosis Date  . Hypertension   . Anxiety   . Depression   . Unspecified hypothyroidism   . Insomnia   . HSV (herpes simplex virus) infection   . Allergy   . Hyperlipidemia     Past Surgical History  Procedure Laterality Date  . Cosmetic surgery      breast implants saline  . Colposcopy     Family History:  Family History  Problem Relation Age of Onset  . Stroke Mother   . Leukemia Father   . Depression Sister   . Depression Sister   . Depression Sister   . Depression Sister   . Heart disease Other   . Hyperlipidemia Other   . Diabetes Other    Social History:   Social History   Social History  . Marital Status: Divorced    Spouse Name: N/A  . Number of Children: N/A  . Years of Education: N/A   Social History Main Topics  . Smoking status: Former Smoker -- 1.00 packs/day    Quit date: 06/08/2006  . Smokeless tobacco: Not on file  . Alcohol Use: 1.8 oz/week    3 Glasses of wine per week  . Drug Use: No  . Sexual Activity: Not on file   Other Topics Concern  . Not on file   Social History Narrative   Additional Social History:   Musculoskeletal: Strength & Muscle Tone: within normal limits Gait & Station: normal Patient leans: N/A  Psychiatric Specialty Exam: HPI  ROS  Blood pressure 149/102, pulse 83, resp. rate 12, height 5' 2.5" (1.588 m), weight 110 lb 9.6 oz (50.168 kg).Body mass index is 19.89 kg/(m^2).  General Appearance: Casual  Eye Contact:  Good  Speech:  Clear and Coherent  Volume:  Normal  Mood:  Euthymic  Affect:  Appropriate  Thought Process:normal  Orientation:  Full (Time, Place, and Person)  Thought Content:  WDL  Suicidal Thoughts:  No  Homicidal Thoughts:  No   Memory:  NA  Judgement:  Good  Insight:  NA and Good  Psychomotor Activity:  Normal  Concentration:  Good  Recall:  Good  Fund of Knowledge:Good  Language: Good  Akathisia:  No  Handed:  Right  AIMS (if indicated):    Assets:  Desire for Improvement  ADL's:  Intact  Cognition: WNL  Sleep:     Is the patient at risk to self?  No. Has the patient been a risk to self in the past 6 months?  No. Has the patient been a risk to self within the distant past?  No. Is the patient a risk to others?  No. Has the patient been a risk to others in the past 6 months?  No. Has the patient been a risk to others within the distant past?  No.  Allergies:   Allergies  Allergen Reactions  . Ambien [Zolpidem Tartrate] Other (See Comments)    hallucinations   Current Medications: Current Outpatient  Prescriptions  Medication Sig Dispense Refill  . acetaminophen (TYLENOL) 500 MG tablet Take 1,000 mg by mouth every 6 (six) hours as needed for moderate pain or headache.    Marland Kitchen acyclovir (ZOVIRAX) 200 MG capsule Take 1 capsule (200 mg total) by mouth 2 (two) times daily. 60 capsule 3  . cephALEXin (KEFLEX) 500 MG capsule Take 1 capsule (500 mg total) by mouth 2 (two) times daily. 14 capsule 0  . diazepam (VALIUM) 5 MG tablet Take 1 tablet (5 mg total) by mouth at bedtime and may repeat dose one time if needed. 15 tablet 0  . diphenhydrAMINE (BENADRYL) 25 MG tablet Take 50 mg by mouth every 6 (six) hours as needed.    . Diphenhydramine-APAP, sleep, (EXCEDRIN PM PO) Take 1 tablet by mouth at bedtime as needed (sleep/pain).    Marland Kitchen escitalopram (LEXAPRO) 20 MG tablet Take 1 tablet (20 mg total) by mouth daily. (Patient not taking: Reported on 07/15/2015) 30 tablet 2  . Eszopiclone 3 MG TABS Take 3 mg by mouth at bedtime as needed (sleep).   0  . levothyroxine (SYNTHROID, LEVOTHROID) 88 MCG tablet Take 1 tablet (88 mcg total) by mouth daily. 90 tablet 0  . meloxicam (MOBIC) 15 MG tablet Take 15 mg by mouth daily.   0  . ondansetron (ZOFRAN) 4 MG tablet Take 1 tablet (4 mg total) by mouth daily as needed for nausea or vomiting. 30 tablet 1  . QUEtiapine (SEROQUEL) 100 MG tablet Take 1 tablet (100 mg total) by mouth at bedtime. 30 tablet 1  . QUEtiapine (SEROQUEL) 25 MG tablet Take 25 mg by mouth at bedtime.     No current facility-administered medications for this visit.    Previous Psychotropic Medications: Yes   Substance Abuse History in the last 12 months:  Yes.    Consequences of Substance Abuse:  Medical Decision Making:  Self-Limited or Minor (1)  Treatment Plan Summary: At this time in my opinion this patient has little clear evidence of bipolar disorder. This of course should be reevaluated but I think this patient would benefit by being in the CD IOP program. The patient is very willing to be in the program. I do not think the patient is dangerous to herself or anyone else. I do not believe this patient is psychotic. I think her only substance is alcohol and that he shows a significant substance abuse disorder. She may have some other characterological issues but at this time I would not overtly treat any of these things based upon 1 interview with her. She is no clear evidence of mania but clearly can get aggressive and agitated which I think is related to alcohol issues and personality issues. I think this patient is stable. I think she clearly benefited by the CD IOP program.    Stacy [name] 2/8/20173:19 PM

## 2015-07-19 ENCOUNTER — Other Ambulatory Visit (HOSPITAL_COMMUNITY): Payer: BLUE CROSS/BLUE SHIELD | Attending: Medical | Admitting: Psychology

## 2015-07-19 ENCOUNTER — Encounter (HOSPITAL_COMMUNITY): Payer: Self-pay

## 2015-07-19 DIAGNOSIS — G4700 Insomnia, unspecified: Secondary | ICD-10-CM | POA: Insufficient documentation

## 2015-07-19 DIAGNOSIS — F419 Anxiety disorder, unspecified: Secondary | ICD-10-CM | POA: Insufficient documentation

## 2015-07-19 DIAGNOSIS — Z91410 Personal history of adult physical and sexual abuse: Secondary | ICD-10-CM | POA: Insufficient documentation

## 2015-07-19 DIAGNOSIS — Z823 Family history of stroke: Secondary | ICD-10-CM | POA: Insufficient documentation

## 2015-07-19 DIAGNOSIS — I10 Essential (primary) hypertension: Secondary | ICD-10-CM | POA: Insufficient documentation

## 2015-07-19 DIAGNOSIS — F1020 Alcohol dependence, uncomplicated: Secondary | ICD-10-CM | POA: Insufficient documentation

## 2015-07-19 DIAGNOSIS — E039 Hypothyroidism, unspecified: Secondary | ICD-10-CM | POA: Insufficient documentation

## 2015-07-19 DIAGNOSIS — Z811 Family history of alcohol abuse and dependence: Secondary | ICD-10-CM | POA: Insufficient documentation

## 2015-07-19 DIAGNOSIS — F329 Major depressive disorder, single episode, unspecified: Secondary | ICD-10-CM | POA: Insufficient documentation

## 2015-07-19 DIAGNOSIS — Z818 Family history of other mental and behavioral disorders: Secondary | ICD-10-CM | POA: Insufficient documentation

## 2015-07-19 DIAGNOSIS — Z806 Family history of leukemia: Secondary | ICD-10-CM | POA: Insufficient documentation
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
## 2015-07-22 ENCOUNTER — Other Ambulatory Visit (HOSPITAL_COMMUNITY): Payer: BLUE CROSS/BLUE SHIELD | Admitting: Medical

## 2015-07-22 DIAGNOSIS — G4700 Insomnia, unspecified: Secondary | ICD-10-CM

## 2015-07-22 DIAGNOSIS — F329 Major depressive disorder, single episode, unspecified: Secondary | ICD-10-CM

## 2015-07-22 DIAGNOSIS — T50905D Adverse effect of unspecified drugs, medicaments and biological substances, subsequent encounter: Secondary | ICD-10-CM

## 2015-07-22 DIAGNOSIS — Z6372 Alcoholism and drug addiction in family: Secondary | ICD-10-CM

## 2015-07-22 DIAGNOSIS — F32A Depression, unspecified: Secondary | ICD-10-CM

## 2015-07-22 DIAGNOSIS — F419 Anxiety disorder, unspecified: Secondary | ICD-10-CM

## 2015-07-22 DIAGNOSIS — F1020 Alcohol dependence, uncomplicated: Secondary | ICD-10-CM

## 2015-07-22 MED ORDER — TRAZODONE HCL 50 MG PO TABS: 50.0000 mg | ORAL_TABLET | Freq: Every evening | ORAL | Status: DC | PRN

## 2015-07-22 MED ORDER — ACAMPROSATE CALCIUM 333 MG PO TBEC: 666.0000 mg | DELAYED_RELEASE_TABLET | Freq: Three times a day (TID) | ORAL | Status: DC

## 2015-07-22 MED ORDER — ESCITALOPRAM OXALATE 20 MG PO TABS: 20.0000 mg | ORAL_TABLET | Freq: Every day | ORAL | Status: DC
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
## 2015-07-23 ENCOUNTER — Other Ambulatory Visit (HOSPITAL_COMMUNITY): Payer: BLUE CROSS/BLUE SHIELD

## 2015-07-24 ENCOUNTER — Other Ambulatory Visit (HOSPITAL_COMMUNITY): Payer: BLUE CROSS/BLUE SHIELD

## 2015-07-24 ENCOUNTER — Encounter (HOSPITAL_COMMUNITY): Payer: Self-pay | Admitting: Medical

## 2015-07-25 ENCOUNTER — Other Ambulatory Visit (HOSPITAL_COMMUNITY): Payer: BLUE CROSS/BLUE SHIELD | Admitting: Licensed Clinical Social Worker

## 2015-07-25 DIAGNOSIS — F1020 Alcohol dependence, uncomplicated: Secondary | ICD-10-CM | POA: Diagnosis present

## 2015-07-25 DIAGNOSIS — Z818 Family history of other mental and behavioral disorders: Secondary | ICD-10-CM | POA: Diagnosis not present

## 2015-07-25 DIAGNOSIS — E039 Hypothyroidism, unspecified: Secondary | ICD-10-CM | POA: Diagnosis not present

## 2015-07-25 DIAGNOSIS — F419 Anxiety disorder, unspecified: Secondary | ICD-10-CM | POA: Diagnosis not present

## 2015-07-25 DIAGNOSIS — I10 Essential (primary) hypertension: Secondary | ICD-10-CM | POA: Diagnosis not present

## 2015-07-25 DIAGNOSIS — Z91410 Personal history of adult physical and sexual abuse: Secondary | ICD-10-CM | POA: Diagnosis not present

## 2015-07-25 DIAGNOSIS — Z811 Family history of alcohol abuse and dependence: Secondary | ICD-10-CM | POA: Diagnosis not present

## 2015-07-25 DIAGNOSIS — Z823 Family history of stroke: Secondary | ICD-10-CM | POA: Diagnosis not present

## 2015-07-25 DIAGNOSIS — Z806 Family history of leukemia: Secondary | ICD-10-CM | POA: Diagnosis not present

## 2015-07-25 DIAGNOSIS — F329 Major depressive disorder, single episode, unspecified: Secondary | ICD-10-CM | POA: Diagnosis not present

## 2015-07-25 DIAGNOSIS — G4700 Insomnia, unspecified: Secondary | ICD-10-CM | POA: Diagnosis not present

## 2015-07-26 ENCOUNTER — Encounter (HOSPITAL_COMMUNITY): Payer: Self-pay | Admitting: Licensed Clinical Social Worker

## 2015-07-26 ENCOUNTER — Telehealth (HOSPITAL_COMMUNITY): Payer: Self-pay | Admitting: Psychology

## 2015-07-26 ENCOUNTER — Other Ambulatory Visit (HOSPITAL_COMMUNITY): Payer: BLUE CROSS/BLUE SHIELD

## 2015-07-26 NOTE — Progress Notes (Signed)
    Daily Group Progress Note  Program: CD-IOP   Group Time: 1-2:30  Participation Level: Active  Behavioral Response: Disruptive  Type of Therapy: Process Group  Topic: CD-IOP Group Process Session: Counselors met with patients for a 1.5 hour group process session in which patients discussed their challenges and successes with sobriety. Counselor encouraged patients' openness and vulnerability when sharing. Additionally, counselor discussed "group rules" and invited members' feedback for making the group safer and more helpful. One member was 30 mins late due to a pre-schedule psychiatric appointment. One new member was present for his first group. CD-IOP Group Psycho-Ed Session: Counselors met with patients for 1.25 hour group psycho-education session in which counselor taught on the Fellowship of AA, meeting structure, and sponsorship. Patients were active and engaged in session and helped answer questions from the new member.      Group Time: 2:45-4  Participation Level: Active  Behavioral Response: Disruptive  Type of Therapy: Psycho-education Group  Topic: CD-IOP Group Psycho-Ed Session: Counselors met with patients for 1.25 hour group psycho-education session in which counselor taught on the Fellowship of AA, meeting structure, and sponsorship. Patients were active and engaged in session and helped answer questions from the new member.     Summary:Patient presented as active and engaged in group. She often chimes in to comment on other people's stories and can sometimes monopolize the time to discuss non-recovery related situations. She reported that she attend 1 AA meeting since our last group, but she was unable to identify the location or even time of the meeting. She is often scatter-brained and unable to answer direct questions lucidly. Patient has many barriers to recovery including: insecure housing due to missed payments; frequent verbal conflict with sisters;  possible loss of health insurance due to missed work. Counselor would do well to help patient simplify her goals and focus on early recovery behaviors such as honesty, attending AA, and doing recovery behaviors despite her mood or feelings about said behaviors. Patient's sobriety date remains 11/18    Family Program: Family present? No   Name of family member(s):   UDS collected: No Results:   AA/NA attended?: YesWednesday  Sponsor?: No   Latangela Mccomas S, Licensed Cli

## 2015-07-29 ENCOUNTER — Other Ambulatory Visit (HOSPITAL_COMMUNITY): Payer: BLUE CROSS/BLUE SHIELD

## 2015-07-29 ENCOUNTER — Encounter (HOSPITAL_COMMUNITY): Payer: Self-pay | Admitting: Licensed Clinical Social Worker

## 2015-07-30 ENCOUNTER — Other Ambulatory Visit (HOSPITAL_COMMUNITY): Payer: BLUE CROSS/BLUE SHIELD

## 2015-07-31 ENCOUNTER — Telehealth (HOSPITAL_COMMUNITY): Payer: Self-pay | Admitting: Licensed Clinical Social Worker

## 2015-07-31 ENCOUNTER — Other Ambulatory Visit (HOSPITAL_COMMUNITY): Payer: BLUE CROSS/BLUE SHIELD

## 2015-07-31 NOTE — Progress Notes (Signed)
Stacy [name] is a 55 y.o. female patient CD-IOP: Met with pt before group to let her know that her insurance has denied CD-IOP coverage. Told her the insurance co would approve a step-down to OP. This counselor will see her individually which is a better fit for this pt to have 1:1 instead of group. Appt scheduled for Thursday 08/01/15.        [name] S, Licensed Cli

## 2015-08-01 ENCOUNTER — Ambulatory Visit (HOSPITAL_COMMUNITY): Payer: Self-pay | Admitting: Licensed Clinical Social Worker

## 2015-08-01 ENCOUNTER — Other Ambulatory Visit (HOSPITAL_COMMUNITY): Payer: BLUE CROSS/BLUE SHIELD

## 2015-08-02 ENCOUNTER — Other Ambulatory Visit (HOSPITAL_COMMUNITY): Payer: BLUE CROSS/BLUE SHIELD

## 2015-08-05 ENCOUNTER — Other Ambulatory Visit (HOSPITAL_COMMUNITY): Payer: BLUE CROSS/BLUE SHIELD

## 2015-08-05 ENCOUNTER — Ambulatory Visit: Payer: Self-pay | Admitting: Physician Assistant

## 2015-08-06 ENCOUNTER — Other Ambulatory Visit (HOSPITAL_COMMUNITY): Payer: BLUE CROSS/BLUE SHIELD

## 2015-08-07 ENCOUNTER — Encounter (HOSPITAL_COMMUNITY): Payer: Self-pay | Admitting: Licensed Clinical Social Worker

## 2015-08-07 ENCOUNTER — Other Ambulatory Visit (HOSPITAL_COMMUNITY): Payer: BLUE CROSS/BLUE SHIELD

## 2015-08-07 ENCOUNTER — Ambulatory Visit (INDEPENDENT_AMBULATORY_CARE_PROVIDER_SITE_OTHER): Payer: BLUE CROSS/BLUE SHIELD | Admitting: Licensed Clinical Social Worker

## 2015-08-07 DIAGNOSIS — F1020 Alcohol dependence, uncomplicated: Secondary | ICD-10-CM

## 2015-08-07 NOTE — Progress Notes (Addendum)
Stacy [name] is a 55 y.o. female patient. Individual Counseling Session: Met with pt for initial individual counseling session. Reviewed CCA with no revisions. Reviewed her FMLA forms for pt to rtw on 3/13. Discussed tx goals for individual therapy. Maintain sobreity, work with sponsor. 5-6 meetings per week. Discussed strategies for going back to work. Provided pt with a list of women's meetings and looked at [place] meeting list within walking distance of her house. Pt is to discuss with her sister what day/time is best for weekly therapy appts. Pt's sobriety date is 11/18. Will see pt weekly. Time spent 50 mins. Dx is F10.20. Participant was alert and appropriate. She presented well.       MACKENZIE,LISBETH S, Licensed Cli

## 2015-08-08 ENCOUNTER — Other Ambulatory Visit (HOSPITAL_COMMUNITY): Payer: BLUE CROSS/BLUE SHIELD

## 2015-08-09 ENCOUNTER — Other Ambulatory Visit (HOSPITAL_COMMUNITY): Payer: BLUE CROSS/BLUE SHIELD

## 2015-08-12 ENCOUNTER — Other Ambulatory Visit (HOSPITAL_COMMUNITY): Payer: BLUE CROSS/BLUE SHIELD

## 2015-08-13 ENCOUNTER — Ambulatory Visit (HOSPITAL_COMMUNITY): Payer: Self-pay | Admitting: Licensed Clinical Social Worker

## 2015-08-13 ENCOUNTER — Other Ambulatory Visit (HOSPITAL_COMMUNITY): Payer: BLUE CROSS/BLUE SHIELD

## 2015-08-14 ENCOUNTER — Other Ambulatory Visit (HOSPITAL_COMMUNITY): Payer: BLUE CROSS/BLUE SHIELD

## 2015-08-15 ENCOUNTER — Ambulatory Visit (HOSPITAL_COMMUNITY): Payer: Self-pay | Admitting: Licensed Clinical Social Worker

## 2015-08-15 ENCOUNTER — Other Ambulatory Visit (HOSPITAL_COMMUNITY): Payer: BLUE CROSS/BLUE SHIELD

## 2015-08-15 ENCOUNTER — Telehealth (HOSPITAL_COMMUNITY): Payer: Self-pay | Admitting: Licensed Clinical Social Worker

## 2015-08-15 ENCOUNTER — Ambulatory Visit (HOSPITAL_COMMUNITY): Payer: BLUE CROSS/BLUE SHIELD | Admitting: Licensed Clinical Social Worker

## 2015-08-15 DIAGNOSIS — F1020 Alcohol dependence, uncomplicated: Secondary | ICD-10-CM

## 2015-08-15 NOTE — Progress Notes (Addendum)
Stacy [name] is a 55 y.o. female patient. Individual session: met with pt today for weekly individual session for 50 minutes. Her DX remains the same F10.20. She presented alert, appropriate. She participated well. Completed, faxed, copied and discussed RTW paperwork for pt. Pt reports she is fearful about going back to work Monday 3/13.. She does not want anyone to know she has been in alcohol tx. Role played with pt phrases she can say to co-workers. Pt has worked as a Catering manager for 30 years, but has recently been out of work due to inpatient and outpatient s/a tx. Pt has a contentious relationship with one sister. She has a good relationship with her 1 sister in [place], but not the sister who lives with their mother. Role played and gave pt index cards with words written that are responses to the mean things her sister says to her. Pt reports she is going to meetings daily and has met some nice sober women. She feels free to ask these women for rides to meetings. Pt has not found a sponsor yet but is still looking for a woman who she can identify with. Sister in [place] is keeping track of pt's out of work paperwork so I emailed her everything I have sent to the pt's employer. Pt expresses her gratitude for everyone assisting her. Pt does not currently have a car so we mapped out a way for her to get to work Monday in [place]. She is going to take the train and catch a cab to the airport in [place]. She will also take a cab to the train station here in [place]. Pt appeared less anxious having a plan in place for transportation Monday. Discussed strategies for coping with her anxiety while on her first trip on Monday. Pt will be out of the country for 3 days. She flies the flight to [place]. She will not go to meetings while in [place] but will take  Her AA big book and other reading materials. Being in the hotel in [place] is a trigger for her so a plan was mapped out: what to do  In   [place], galleries, museums, coffee shops. Her co-workers will be back in the hotel drinking, which is what the pt used to do. This may be a difficult time for her. Made appt for next week Friday 3/18.        MACKENZIE,LISBETH S, Licensed Cli

## 2015-08-16 ENCOUNTER — Other Ambulatory Visit (HOSPITAL_COMMUNITY): Payer: BLUE CROSS/BLUE SHIELD

## 2015-08-19 ENCOUNTER — Telehealth (HOSPITAL_COMMUNITY): Payer: Self-pay | Admitting: Licensed Clinical Social Worker

## 2015-08-19 ENCOUNTER — Other Ambulatory Visit (HOSPITAL_COMMUNITY): Payer: BLUE CROSS/BLUE SHIELD

## 2015-08-20 ENCOUNTER — Other Ambulatory Visit (HOSPITAL_COMMUNITY): Payer: BLUE CROSS/BLUE SHIELD

## 2015-08-21 ENCOUNTER — Other Ambulatory Visit (HOSPITAL_COMMUNITY): Payer: BLUE CROSS/BLUE SHIELD

## 2015-08-22 ENCOUNTER — Other Ambulatory Visit (HOSPITAL_COMMUNITY): Payer: BLUE CROSS/BLUE SHIELD

## 2015-08-23 ENCOUNTER — Other Ambulatory Visit (HOSPITAL_COMMUNITY): Payer: BLUE CROSS/BLUE SHIELD

## 2015-08-23 ENCOUNTER — Ambulatory Visit (HOSPITAL_COMMUNITY): Payer: Self-pay | Admitting: Licensed Clinical Social Worker

## 2015-08-30 ENCOUNTER — Ambulatory Visit (INDEPENDENT_AMBULATORY_CARE_PROVIDER_SITE_OTHER): Payer: BLUE CROSS/BLUE SHIELD | Admitting: Licensed Clinical Social Worker

## 2015-08-30 DIAGNOSIS — F32A Depression, unspecified: Secondary | ICD-10-CM

## 2015-08-30 DIAGNOSIS — F329 Major depressive disorder, single episode, unspecified: Secondary | ICD-10-CM | POA: Diagnosis not present

## 2015-08-30 DIAGNOSIS — F1020 Alcohol dependence, uncomplicated: Secondary | ICD-10-CM

## 2015-09-02 ENCOUNTER — Encounter (HOSPITAL_COMMUNITY): Payer: Self-pay | Admitting: Licensed Clinical Social Worker

## 2015-09-02 NOTE — Progress Notes (Signed)
Stacy [name] is a 55 y.o. female patient. BH Individual Counseling Session: Met with pt for individual session 50 minutes. Pt has not been in touch with the office in almost 3 weeks. She canceled her last appt and has not responded to emails or voicemails. Pt reported she did go back to work but can't quite adjust to working and taking care of her own needs. She reported she has 3 pending DWIs but has not idea of her court dates. Called Clerk's office and got the court dates for her. Reminded her she needs to call her attorney to find out what she  Needs to do. Pt reprts she is still struggling with her sleep patters and is still taking the Trazadone. She rescheduled her appt for Dr. Doyne Keel due to her work schedule and cant be seen until May. Pt reports she is not drinking with the same sobreity date but has not gone to her regular meetings since she started back to work. Pt is struggling with her relationships with her sisters who basically take care of her. They say mean things to her. Role played how she can respond to the hurtful remarks from her sisters. Pt is struggling financially due to being out of work for so long. She does at least still have her job of 30 years. Had pt write 10 positive affirmations and place them on her mirror to repeat to herself every morning. Pt will still be seen 1x per week to work on her goals: remain sober, 12-step community, self-esteem, take meds as directed, see psychiatrist. Work on sleep health.Next appt is 3/30.        Natasja Niday S, Licensed Cli

## 2015-09-05 ENCOUNTER — Ambulatory Visit (INDEPENDENT_AMBULATORY_CARE_PROVIDER_SITE_OTHER): Payer: BLUE CROSS/BLUE SHIELD | Admitting: Licensed Clinical Social Worker

## 2015-09-05 DIAGNOSIS — F1020 Alcohol dependence, uncomplicated: Secondary | ICD-10-CM | POA: Diagnosis not present

## 2015-09-06 ENCOUNTER — Encounter (HOSPITAL_COMMUNITY): Payer: Self-pay | Admitting: Licensed Clinical Social Worker

## 2015-09-06 NOTE — Progress Notes (Signed)
   THERAPIST PROGRESS NOTE  Session Time: 50 mins  Participation Level: Active  Behavioral Response: disheveledConfusedAnxious  Type of Therapy: Individual Therapy  Treatment Goals addressed: Anxiety and Diagnosis: Alcoholism  Interventions: Solution Focused and Supportive  Summary: Stacy HerbertRosaleen M [name] is a 55 y.o. female who presents with anxiety and somewhat confused. Pt is very disorganized and is unable to focus. She has 3 pending DWIs and is confused who her attorney is or when her court dates are. Called Clerk'[name] office to get court dates for her. Pt has been charged with a Felony DWI due to bodily injury of the other driver. She does not remember any of her DWIs. Pt is back at work but on her first flight she hurt herself with the beverage cart and now must file workers compensation. She struggles with the decision of whether to file or not but has an injury on the top of her foot. Pt is sporadically going to AA meetings due to her lack of transportation and her work schedule. Her [place] in [place] [facility] [place] and will help the pt get her affairs in order. She will also provide transportation to pt to go to the airport at [employer] meetings and therapist appointments. Pt has an appt scheduled with Dr Kenna GilbertArgarwal for May to review her medications. She is taking her medications as prescribed now. Pt does have contact with her sponsor weekly and goes to a meeting with her weekly. The pt has reached out to sober women to have coffee with and go to meetings. She is reading her AA books and takes them with her on her work trips. She reports she is financially unstable now due to her out-of work status for so long. She is trying to get her bills in order. Pt is remaining sober with her sobriety date still 11/18. .   Suicidal/Homicidal: Nowithout intent/plan  Therapist Response: Pt will need more case management needs to deal with all of her out of work paperwork. She is  struggling to maintain all aspects of her life. With her inability to focus she is wavering over many different aspects of her life. I'm hoping when her sister comes to town she can take care of the cm needs and helping pt to figure her life out. Will continue to work with pt on her alcohol use disorder.  Plan: Return again in 1 week.  Diagnosis: Axis I: Alcohol use disorder    Axis II: none    Stacy [name], Licensed Cli 09/06/2015

## 2015-09-11 ENCOUNTER — Ambulatory Visit (INDEPENDENT_AMBULATORY_CARE_PROVIDER_SITE_OTHER): Payer: BLUE CROSS/BLUE SHIELD | Admitting: Licensed Clinical Social Worker

## 2015-09-11 ENCOUNTER — Encounter (HOSPITAL_COMMUNITY): Payer: Self-pay | Admitting: Licensed Clinical Social Worker

## 2015-09-11 DIAGNOSIS — F1020 Alcohol dependence, uncomplicated: Secondary | ICD-10-CM | POA: Diagnosis not present

## 2015-09-11 NOTE — Progress Notes (Signed)
Stacy [name] is a 55 y.o. female patient 50 minutes  Met with pt for individual session. Sister came with her briefly to discuss letters for her attorney. Pt presented with flat affect. Her appearance was frazzled. Pt has a lot of chaos in her life now and is trying to work it all out, which she cannot do alone. Her sisters have helped pt most of her adult life and they are all emeshed. Pt does not really know how to take care of her affairs. Pt has not been to any AA meetings in 2 weeks. Discussed with pt she only has 2 things to focus on now: work and recovery. She has to go to meetings, continue to work with her new sponsor, and begin to work the steps. Shared with pt she is sober, she is not in recovery. 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 She is more worried about what they think. Worked with pt on control. She only has control of her own life. "Take one day at a time"...literally. Gave pt information on guided meditations for stress and sleep. Showed her how to access these meditations on her phone. She can even use them when she is flying. Worked with pt on breathing exercises to assist in stress and anxiety. She practiced the exercises. Will continue to work with pt on moving forward in her recovery.         Tylah Mancillas S, Licensed Cli

## 2015-09-16 ENCOUNTER — Ambulatory Visit (INDEPENDENT_AMBULATORY_CARE_PROVIDER_SITE_OTHER): Payer: BLUE CROSS/BLUE SHIELD | Admitting: Licensed Clinical Social Worker

## 2015-09-16 DIAGNOSIS — F1020 Alcohol dependence, uncomplicated: Secondary | ICD-10-CM

## 2015-09-17 ENCOUNTER — Ambulatory Visit (HOSPITAL_COMMUNITY): Payer: Self-pay | Admitting: Licensed Clinical Social Worker

## 2015-09-19 ENCOUNTER — Encounter (HOSPITAL_COMMUNITY): Payer: Self-pay | Admitting: Licensed Clinical Social Worker

## 2015-09-19 ENCOUNTER — Ambulatory Visit (HOSPITAL_COMMUNITY): Payer: Self-pay | Admitting: Psychiatry

## 2015-09-19 NOTE — Progress Notes (Signed)
Ofilia SHALYNN JORSTAD is a 55 y.o. female patient. 50 minutes Met with pt for individual appt. Pt reports she is overwhelmed with trying to clean up all her messes while being an active alcoholic. Worked with pt to strategize and compartmentalize her life. Also had pt work to [employer] goals to accomplish all of her tasks. Pt is not attending meetings, has excuses. Processed being dry but not sober. She is not working a Dietitian. Expressed concern to pt if she does not begin a recovery program she may relapse and cause her life  To become unmanageable again. Pt is extrememly tired and is struggling to work and clean up her live. Pt is able to open up about her feelings, work in Manufacturing engineer and compartmentalizing the many facets of her life. Worked with pt on mediation and breathing exercises. Also gave pt information on guided meditation for sleep and anxiety. Will continue to monitor progress of pt.        Arfa Lamarca S, Licensed Cli

## 2015-09-23 ENCOUNTER — Telehealth (HOSPITAL_COMMUNITY): Payer: Self-pay

## 2015-09-23 ENCOUNTER — Ambulatory Visit (HOSPITAL_COMMUNITY): Payer: Self-pay | Admitting: Licensed Clinical Social Worker

## 2015-10-09 ENCOUNTER — Ambulatory Visit (INDEPENDENT_AMBULATORY_CARE_PROVIDER_SITE_OTHER): Payer: BLUE CROSS/BLUE SHIELD | Admitting: Licensed Clinical Social Worker

## 2015-10-09 ENCOUNTER — Ambulatory Visit (HOSPITAL_COMMUNITY): Payer: Self-pay | Admitting: Licensed Clinical Social Worker

## 2015-10-09 DIAGNOSIS — F1020 Alcohol dependence, uncomplicated: Secondary | ICD-10-CM

## 2015-10-09 DIAGNOSIS — F419 Anxiety disorder, unspecified: Secondary | ICD-10-CM

## 2015-10-10 ENCOUNTER — Encounter (HOSPITAL_COMMUNITY): Payer: Self-pay | Admitting: Psychiatry

## 2015-10-10 ENCOUNTER — Ambulatory Visit (INDEPENDENT_AMBULATORY_CARE_PROVIDER_SITE_OTHER): Payer: BLUE CROSS/BLUE SHIELD | Admitting: Psychiatry

## 2015-10-10 VITALS — BP 160/96 | HR 80 | Ht 62.5 in | Wt 114.0 lb

## 2015-10-10 DIAGNOSIS — F4322 Adjustment disorder with anxiety: Secondary | ICD-10-CM | POA: Insufficient documentation

## 2015-10-10 DIAGNOSIS — F1021 Alcohol dependence, in remission: Secondary | ICD-10-CM | POA: Diagnosis not present

## 2015-10-10 DIAGNOSIS — G4700 Insomnia, unspecified: Secondary | ICD-10-CM | POA: Diagnosis not present

## 2015-10-10 MED ORDER — ESCITALOPRAM OXALATE 20 MG PO TABS: 20.0000 mg | ORAL_TABLET | Freq: Every day | ORAL | Status: DC

## 2015-10-10 MED ORDER — TRAZODONE HCL 150 MG PO TABS: 150.0000 mg | ORAL_TABLET | Freq: Every evening | ORAL | Status: DC | PRN

## 2015-10-10 NOTE — Progress Notes (Signed)
Psychiatric Initial Adult Assessment   Patient Identification: Stacy [name] MRN:  [id number] Date of Evaluation:  10/10/2015 Referral Source: CD-IOP Chief Complaint:   Chief Complaint    Alcohol Problem     Visit Diagnosis:    ICD-9-CM ICD-10-CM   1. Alcohol use disorder, severe, in early remission (HCC) 305.03 F10.21   2. Adjustment disorder with anxious mood 309.24 F43.22 escitalopram (LEXAPRO) 20 MG tablet  3. Insomnia 780.52 G47.00 traZODone (DESYREL) 150 MG tablet    History of Present Illness:  Pt here to establish care for treatment of anxiety and alcohol dependence. Pt has had 3 DUI and has an upcoming court date. Pt last drank in Nov 2016. She now attends AA meetings 2x/week, has a sponsor she talks with regularly and attends therapy with [facility].   She is experiencing some anxiety but is situational. It is related to her sister and family problems and her upcoming legal consequences from the DUI. Pt has on/off anxiety and she manages it.   Sleep is bad. Pt takes Trazodone but it still takes her an hour to fall asleep.  Pt sleeps about 6 hrs/night. Pt has irregular sleep pattens when working due to her working schedule and time changes. Appetite is good.  Energy is low.   Taking meds as prescribed and denies SE.   Associated Signs/Symptoms: Depression Symptoms:  insomnia, fatigue, anxiety,  Pt denies depression. Denies anhedonia, isolation, crying spells, low motivation, poor hygiene, worthlessness and hopelessness. Denies SI/HI.  (Hypo) Manic Symptoms:  negative  Denies manic and hypomanic symptoms including periods of decreased need for sleep, increased energy, mood lability, impulsivity, FOI, and excessive spending.  Anxiety Symptoms:  Excessive Worry, denies OCD, panic attacks, specific phobias and social anxiety   Psychotic Symptoms:  negative  PTSD Symptoms: Negative  Past Psychiatric History:  Dx: Alcohol Dependence, Anxiety, Depression Meds:  lunesta (hallucinations), Seroquel, Xanax Previous psychiatrist/therapist: counselor in [place] [facility]: [facility] for 1 day after bad reaction to medication SIB: denies Suicide attempts: denies Hx of violent behavior towards others: denies Current access to guns: denies Hx of abuse: denies Military Hx: denies Hx of Seizures: denies Hx of TBI: denies   Previous Psychotropic Medications: Yes   Substance Abuse History in the last 12 months:  Yes.    Consequences of Substance Abuse: Medical Consequences:  denies Legal Consequences:  DUIx3, last in 2014 Family Consequences:  denies Blackouts:  denies DT's: denies Withdrawal Symptoms:   None pt detox on her own [facility]hab- [facility]  Past Medical History:  Past Medical History  Diagnosis Date  . Hypertension   . Anxiety   . Depression   . Insomnia   . HSV (herpes simplex virus) infection   . Allergy   . Hyperlipidemia   . Unspecified hypothyroidism     [place]  . Alcohol abuse     Past Surgical History  Procedure Laterality Date  . Cosmetic surgery      breast implants saline  . Colposcopy      Family Psychiatric and Medical History:  Family History  Problem Relation Age of Onset  . Stroke Mother   . Leukemia Father   . Alcohol abuse Father   . Depression Sister   . Alcohol abuse Sister   . Bipolar disorder Sister   . Depression Sister   . Depression Sister   . Alcohol abuse Sister   . Depression Sister   . Heart disease Other   . Hyperlipidemia Other   .  Diabetes Other   . Alcohol abuse Maternal Uncle   . Alcohol abuse Paternal Uncle   . Alcohol abuse Paternal Grandfather     Social History:   Social History   Social History  . Marital Status: Divorced    Spouse Name: N/A  . Number of Children: N/A  . Years of Education: N/A   Social History Main Topics  . Smoking status: Former Smoker -- 1.00 packs/day    Quit date: 06/08/2006  . Smokeless tobacco: Never Used   . Alcohol Use: 54.0 oz/week    90 Glasses of wine per week     Comment: last drink was in November 2016.   . Drug Use: No  . Sexual Activity: Not Asked   Other Topics Concern  . None   Social History Narrative   Pt lives in [place] alone. Pt has completed HS. Pt works for Interior and spatial designerairline hostess for 30 yrs. She does [place] to [employer]. Divorced, married x2 for 10 yrs. No kids.     Allergies:   Allergies  Allergen Reactions  . Lunesta [Eszopiclone] Other (See Comments)    PSYCHOSIS  . Ambien [Zolpidem Tartrate] Other (See Comments)    hallucinations    Metabolic Disorder Labs: Lab Results  Component Value Date   HGBA1C 5.3 01/15/2015   MPG 105 01/15/2015   No results found for: PROLACTIN Lab Results  Component Value Date   CHOL 187 01/15/2015   TRIG 49 01/15/2015   HDL 91 01/15/2015   CHOLHDL 2.1 01/15/2015   VLDL 10 01/15/2015   LDLCALC 86 01/15/2015   LDLCALC 104* 08/08/2014     Current Medications: Current Outpatient Prescriptions  Medication Sig Dispense Refill  . acetaminophen (TYLENOL) 500 MG tablet Take 1,000 mg by mouth every 6 (six) hours as needed for moderate pain or headache.    Marland Kitchen. acyclovir (ZOVIRAX) 200 MG capsule Take 1 capsule (200 mg total) by mouth 2 (two) times daily. 60 capsule 3  . diphenhydrAMINE (BENADRYL) 25 MG tablet Take 50 mg by mouth every 6 (six) hours as needed. Reported on 10/10/2015    . escitalopram (LEXAPRO) 20 MG tablet Take 1 tablet (20 mg total) by mouth daily. 30 tablet 2  . levothyroxine (SYNTHROID, LEVOTHROID) 88 MCG tablet Take 1 tablet (88 mcg total) by mouth daily. 90 tablet 0  . meloxicam (MOBIC) 15 MG tablet Take 15 mg by mouth daily.  0  . traZODone (DESYREL) 50 MG tablet Take 1 tablet (50 mg total) by mouth at bedtime as needed and may repeat dose one time if needed for sleep. 60 tablet 2  . acamprosate (CAMPRAL) 333 MG tablet Take 2 tablets (666 mg total) by mouth 3 (three) times daily with meals. (Patient not taking: Reported on  10/10/2015) 180 tablet 0  . cephALEXin (KEFLEX) 500 MG capsule Take 1 capsule (500 mg total) by mouth 2 (two) times daily. (Patient not taking: Reported on 10/10/2015) 14 capsule 0  . diazepam (VALIUM) 5 MG tablet Take 1 tablet (5 mg total) by mouth at bedtime and may repeat dose one time if needed. (Patient not taking: Reported on 07/19/2015) 15 tablet 0  . Diphenhydramine-APAP, sleep, (EXCEDRIN PM PO) Take 1 tablet by mouth at bedtime as needed (sleep/pain). Reported on 10/10/2015    . ondansetron (ZOFRAN) 4 MG tablet Take 1 tablet (4 mg total) by mouth daily as needed for nausea or vomiting. (Patient not taking: Reported on 10/10/2015) 30 tablet 1  . QUEtiapine (SEROQUEL) 100 MG tablet Take 1  tablet (100 mg total) by mouth at bedtime. (Patient not taking: Reported on 07/19/2015) 30 tablet 1  . QUEtiapine (SEROQUEL) 25 MG tablet Take 25 mg by mouth at bedtime. Reported on 10/10/2015     No current facility-administered medications for this visit.    Neurologic: Headache: No Seizure: No Paresthesias:No  Musculoskeletal: Strength & Muscle Tone: within normal limits Gait & Station: normal Patient leans: straight  Psychiatric Specialty Exam: Review of Systems  Constitutional: Negative for fever and chills.  HENT: Positive for congestion. Negative for ear pain and sore throat.   Eyes: Positive for blurred vision. Negative for double vision.  Respiratory: Negative for cough, shortness of breath and wheezing.   Cardiovascular: Negative for chest pain, palpitations and leg swelling.  Gastrointestinal: Negative for heartburn, nausea, vomiting and abdominal pain.  Musculoskeletal: Positive for back pain and joint pain. Negative for neck pain.  Skin: Negative for itching and rash.  Neurological: Negative for dizziness, tremors, focal weakness, seizures, loss of consciousness, weakness and headaches.    Blood pressure 160/96, pulse 80, height 5' 2.5" (1.588 m), weight 114 lb (51.71 kg).Body mass index is  20.51 kg/(m^2).  General Appearance: Fairly Groomed  Eye Contact:  Good  Speech:  Clear and Coherent and Normal Rate  Volume:  Normal  Mood:  Euthymic  Affect:  Appropriate  Thought Process:  Circumstantial  Orientation:  Full (Time, Place, and Person)  Thought Content:  Negative  Suicidal Thoughts:  No  Homicidal Thoughts:  No  Memory:  Immediate;   Fair Recent;   Fair Remote;   Fair  Judgement:  Fair  Insight:  Present  Psychomotor Activity:  Normal  Concentration:  Good  Recall:  Good  Fund of Knowledge:Good  Language: Good  Akathisia:  No  Handed:  Right  AIMS (if indicated):  n/a  Assets:  Communication Skills Desire for Improvement  ADL's:  Intact  Cognition: WNL  Sleep:  fair    Treatment Plan Summary: Medication management and Plan see below   Assessment: Alcohol use disorder- severe, in early remission; Adjustment disorder with anxious mood; Insomnia   Medication management with supportive therapy. Risks/benefits and SE of the medication discussed. Pt verbalized understanding and verbal consent obtained for treatment.  Affirm with the patient that the medications are taken as ordered. Patient expressed understanding of how their medications were to be used.  Meds: Increase Trazodone  po qHS prn insomnia D/c Seroquel D/c Campral D/c Valium Continue Lexapro  po qD for mood and anxiety   Labs:  CBC WNL , CMP WNL, TSH WNL, UDS WNL   Therapy: brief supportive therapy provided. Discussed psychosocial stressors in detail.   Reviewed sleep hygiene in detail Recommended pt stop all drug and alcohol use  Consultations:  Encouraged to continue therapy, AA and CD-IOP  Pt denies SI and is at an acute low risk for suicide. Patient told to call clinic if any problems occur. Patient advised to go to ER if they should develop SI/HI, side effects, or if symptoms worsen. Has crisis numbers to call if needed. Pt verbalized understanding.  F/up in 2 months or  sooner if needed  Oletta Darter, MD 5/4/20173:02 PM

## 2015-10-14 ENCOUNTER — Encounter (HOSPITAL_COMMUNITY): Payer: Self-pay | Admitting: Licensed Clinical Social Worker

## 2015-10-14 ENCOUNTER — Ambulatory Visit (INDEPENDENT_AMBULATORY_CARE_PROVIDER_SITE_OTHER): Payer: BLUE CROSS/BLUE SHIELD | Admitting: Licensed Clinical Social Worker

## 2015-10-14 DIAGNOSIS — F1021 Alcohol dependence, in remission: Secondary | ICD-10-CM | POA: Diagnosis not present

## 2015-10-14 DIAGNOSIS — F419 Anxiety disorder, unspecified: Secondary | ICD-10-CM

## 2015-10-14 DIAGNOSIS — F4322 Adjustment disorder with anxiety: Secondary | ICD-10-CM

## 2015-10-14 NOTE — Progress Notes (Signed)
   THERAPIST PROGRESS NOTE  Session Time: 50 minutes  Participation Level: Active  Behavioral Response: CasualAlert/anxious  Type of Therapy: Individual Therapy  Treatment Goals addressed: Anxiety/allcohol  Interventions: Solution Focused, Assertiveness Training and Meditation: guided imagery for sleep  Summary: Stacy [name] is a 55 y.o. female who presents with anxiety due to the turmoil within her family. Her sister Stacy [name] has moved to [place] temporarily. The sister who cares for the mother is bullying all the sisters (4). It has upset this pt to an extent she is ruminating about her turmoil and her inability to fix it. Worked with pt on "control" - what she can and cannot control. The lace of control leads to her anxiety and she is not using any of her coping skills. Went over the guided meditation for sleep and showed her on her phone how to use it at night. Pt will see Dr. Kenna GilbertArgarwal tomorrow for medication management and will speak with her about her Trazadone. Pt has upcoming DWI charges (3) and has much anxiety about the outcome of the charges. Worked with pt on mindfulness and being present. Gave her a list of several mindfulness exercises she an use. Also showed pt breathing exercises that she can use anytime. PT is feeling more secure since her other sister is in town temporarily. Pt is meeting her sponsor Friday and going to a AA meeting with her. She still continues to talk with her when not out of town (flying.) Pt is on vacation for 3 weeks. Made appt to see her next week.  Suicidal/Homicidal: No  Therapist Response: Working with pt on what she can and cannot control. Worked with pt on her coping skills for anxiety. Stressed with pt the importance of sponsorship and continuing to attend AA meetings. Pt sobriety date remains 04/26/15.  Plan: Return again in 1 weeks.  Diagnosis: Axis I: F10.20    Axis II: Generalized Anxiety Disorder    Stacy [name], Licensed  Cli 10/14/2015

## 2015-10-14 NOTE — Progress Notes (Signed)
   THERAPIST PROGRESS NOTE  Session Time: 50 minutes  Participation Level: Active  Behavioral Response: CasualAlertAnxious  Type of Therapy: Individual Therapy  Treatment Goals addressed: Anxiety/Alcoholism  Interventions: Solution Focused, Strength-based, Supportive and Meditation: guided imagery for sleep  Summary: Stacy HerbertRosaleen M [name] is a 55 y.o. female who presents with anxiety. Pt requests a letter for her attorney for her 3 DWI's. Completed letter for pt. Pt is still expereincing anxiety due to family distress. One sister is bullying all other sisters. Family has decided to take out a restraining order. Discussed pt's feelings behind this decision. Pt is distraught it has come to court-intervention. Pt is still ruminating at night over family issues. Showed pt again on her phone how to initiate guided imagery for sleep on youtube. Pt is still on vacation and is trying to get things in order which she has not taken care of while she was drinking. Pt reports she is doing some mindfulness activities. Pt is meeting with her sponsor this week and going to a meeting together. Pt reports she is working on eating healthier and taking walks daily.   Suicidal/Homicidal: No  Therapist Response: Completed letter for court and attorney. Discussed with pt again about control and explained what she has control over. Pt is learning coping skills for her anxiety and shares she will try to use them. Still reminding pt of the importance of her sobreity especially in this stressful time. Pt is due to go to court next week.  Plan: Return again in 1 weeks.  Diagnosis: Axis I: F10.20    Axis II: Anxiety    Stacy [name] S, Licensed Cli 10/14/2015

## 2015-10-17 ENCOUNTER — Ambulatory Visit (HOSPITAL_COMMUNITY): Payer: Self-pay | Admitting: Licensed Clinical Social Worker

## 2015-10-22 ENCOUNTER — Ambulatory Visit (INDEPENDENT_AMBULATORY_CARE_PROVIDER_SITE_OTHER): Payer: BLUE CROSS/BLUE SHIELD | Admitting: Licensed Clinical Social Worker

## 2015-10-22 ENCOUNTER — Telehealth (HOSPITAL_COMMUNITY): Payer: Self-pay

## 2015-10-22 DIAGNOSIS — F1021 Alcohol dependence, in remission: Secondary | ICD-10-CM

## 2015-10-22 DIAGNOSIS — G4700 Insomnia, unspecified: Secondary | ICD-10-CM

## 2015-10-23 NOTE — Progress Notes (Signed)
Stacy [name] is a 55 y.o. female patient. Met with pt for her individual appt. Pt presented anxious and flustered. She had gone to court yesterday for her DWI. She has other DWI pending. Pt expressed her anxiety about now being on supervised probation. 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 She is worried about how supervised probation will affect her job. 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 Role played with pt on how to address this with her probation officer. After practicing the role play several times she was once again anxious so we worked on controlled breathing exercises. Showed pt several guided imagery for sleep, anxiety and depression to use at night and in the mornings. Pt has been staying at her mother's house since her sister from [place] is here. Another sister is bullying her in the house. Problem solved with her alternatives. Pt was much less anxious and flustered upon leaving.        MACKENZIE,LISBETH S, Licensed Cli

## 2015-10-24 MED ORDER — TRAZODONE HCL 100 MG PO TABS: 200.0000 mg | ORAL_TABLET | Freq: Every day | ORAL | Status: DC

## 2015-10-24 NOTE — Telephone Encounter (Signed)
We can increase her Trazodone to 200mg  po qHS

## 2015-10-24 NOTE — Telephone Encounter (Signed)
Order sent to pharmacy, I called the patient and advised about the change and patient verbalized her understanding.

## 2015-10-30 ENCOUNTER — Telehealth (HOSPITAL_COMMUNITY): Payer: Self-pay | Admitting: Licensed Clinical Social Worker

## 2015-11-02 ENCOUNTER — Ambulatory Visit (HOSPITAL_COMMUNITY)
Admission: EM | Admit: 2015-11-02 | Discharge: 2015-11-02 | Disposition: A | Discharge status: Home / Self Care | Payer: BLUE CROSS/BLUE SHIELD | Attending: Emergency Medicine | Admitting: Emergency Medicine

## 2015-11-02 ENCOUNTER — Encounter (HOSPITAL_COMMUNITY): Payer: Self-pay | Admitting: *Deleted

## 2015-11-02 DIAGNOSIS — G43009 Migraine without aura, not intractable, without status migrainosus: Secondary | ICD-10-CM

## 2015-11-02 MED ORDER — ONDANSETRON HCL 4 MG PO TABS: 4.0000 mg | ORAL_TABLET | Freq: Four times a day (QID) | ORAL | Status: DC

## 2015-11-02 MED ORDER — DIPHENHYDRAMINE HCL 50 MG/ML IJ SOLN
50.0000 mg | Freq: Once | INTRAMUSCULAR | Status: AC
  Administered 2015-11-02: 50 mg via INTRAMUSCULAR

## 2015-11-02 MED ORDER — SUMATRIPTAN SUCCINATE 50 MG PO TABS: 50.0000 mg | ORAL_TABLET | Freq: Once | ORAL | Status: DC

## 2015-11-02 MED ORDER — SUMATRIPTAN SUCCINATE 6 MG/0.5ML ~~LOC~~ SOLN
6.0000 mg | Freq: Once | SUBCUTANEOUS | Status: AC
  Administered 2015-11-02: 6 mg via SUBCUTANEOUS

## 2015-11-02 MED ORDER — SUMATRIPTAN SUCCINATE 6 MG/0.5ML ~~LOC~~ SOLN
SUBCUTANEOUS | Status: AC
  Filled 2015-11-02: qty 0.5

## 2015-11-02 MED ORDER — KETOROLAC TROMETHAMINE 60 MG/2ML IM SOLN
60.0000 mg | Freq: Once | INTRAMUSCULAR | Status: AC
  Administered 2015-11-02: 60 mg via INTRAMUSCULAR

## 2015-11-02 MED ORDER — ONDANSETRON 4 MG PO TBDP
ORAL_TABLET | ORAL | Status: AC
  Filled 2015-11-02: qty 2

## 2015-11-02 MED ORDER — ONDANSETRON 4 MG PO TBDP
8.0000 mg | ORAL_TABLET | Freq: Once | ORAL | Status: AC
  Administered 2015-11-02: 8 mg via ORAL

## 2015-11-02 MED ORDER — DIPHENHYDRAMINE HCL 50 MG/ML IJ SOLN
INTRAMUSCULAR | Status: AC
  Filled 2015-11-02: qty 1

## 2015-11-02 MED ORDER — KETOROLAC TROMETHAMINE 60 MG/2ML IM SOLN
INTRAMUSCULAR | Status: AC
  Filled 2015-11-02: qty 2

## 2015-11-02 NOTE — ED Notes (Signed)
Pt verified she has a ride home with family; verified via telephone while RN in room.

## 2015-11-02 NOTE — ED Notes (Signed)
Pt reports "migraine" x 24 hrs.  Had hx of migraines approx 8 yrs ago - had often needed shots, but then they resolved.  States this HA feels similar.  C/O nausea, photosensitivity.  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  Had taken "Panadol 500mg " while in [place] without relief.

## 2015-11-02 NOTE — Discharge Instructions (Signed)

## 2015-11-02 NOTE — ED Provider Notes (Signed)
CSN: [id number]     Arrival date & time 11/02/15  1613 History   None    Chief Complaint  Patient presents with  . Headache   (Consider location/radiation/quality/duration/timing/severity/associated sxs/prior Treatment)  HPI   The patient is a 55 year old female presenting today with complaints of a migraine headache 24 hours. 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 Patient states she has a history significant for migraines but has not had one for approximately 5 years.Patient reports positive photophobia. Negative aura. Patient reports positive nausea but negative vomiting.  She reports the headache started off as frontal in nature but is now radiating across her whole head and neck. Medical history significant for Graves' disease which she takes her Synthroid daily and reports well controlled. Used to take Imitrex injections when headaches were severe.  Past Medical History  Diagnosis Date  . Hypertension   . Anxiety   . Depression   . Insomnia   . HSV (herpes simplex virus) infection   . Allergy   . Hyperlipidemia   . Unspecified hypothyroidism     Graves  . Alcohol abuse    Past Surgical History  Procedure Laterality Date  . Cosmetic surgery      breast implants saline  . Colposcopy     Family History  Problem Relation Age of Onset  . Stroke Mother   . Leukemia Father   . Alcohol abuse Father   . Depression Sister   . Alcohol abuse Sister   . Bipolar disorder Sister   . Depression Sister   . Depression Sister   . Alcohol abuse Sister   . Depression Sister   . Heart disease Other   . Hyperlipidemia Other   . Diabetes Other   . Alcohol abuse Maternal Uncle   . Alcohol abuse Paternal Uncle   . Alcohol abuse Paternal Grandfather    Social History  Substance Use Topics  . Smoking status: Former Smoker -- 1.00 packs/day    Quit date: 06/08/2006  . Smokeless tobacco: Never Used   . Alcohol Use: 54.0 oz/week    90 Glasses of wine per week     Comment: Per previous documentation: 90 glasses of wine per week; pt now states only occasional alcohol use 11/02/15   OB History    No data available     Review of Systems  Constitutional: Negative.  Negative for fever and fatigue.  HENT: Negative for congestion, ear pain, sinus pressure and sore throat.   Eyes: Positive for photophobia. Negative for pain, discharge, redness, itching and visual disturbance.  Respiratory: Negative.  Negative for cough and shortness of breath.   Cardiovascular: Negative.  Negative for chest pain and leg swelling.  Gastrointestinal: Negative.   Endocrine: Negative.   Genitourinary: Negative.   Musculoskeletal: Negative.  Negative for gait problem and neck stiffness.  Skin: Negative.  Negative for rash.  Allergic/Immunologic: Negative.   Neurological: Positive for headaches. Negative for dizziness, tremors, syncope, facial asymmetry, speech difficulty, weakness, light-headedness and numbness.  Hematological: Negative.   Psychiatric/Behavioral: Negative.     Allergies  Lunesta and Ambien  Home Medications   Prior to Admission medications   Medication Sig Start Date End Date Taking? Authorizing Provider  escitalopram (LEXAPRO) 20 MG tablet Take 1 tablet (20 mg total) by mouth daily. 10/10/15 10/10/17 Yes Oletta Darter, MD  levothyroxine (SYNTHROID, LEVOTHROID) 88 MCG tablet Take 1 tablet (88 mcg total) by mouth  daily. 04/02/15  Yes Quentin Mulling, PA-C  traZODone (DESYREL) 100 MG tablet Take 2 tablets (200 mg total) by mouth at bedtime. 10/24/15  Yes Oletta Darter, MD  acetaminophen (TYLENOL) 500 MG tablet Take 1,000 mg by mouth every 6 (six) hours as needed for moderate pain or headache.    Historical Provider, MD  acyclovir (ZOVIRAX) 200 MG capsule Take 1 capsule (200 mg total) by mouth 2 (two) times daily. 09/26/14   Quentin Mulling, PA-C  cephALEXin (KEFLEX) 500 MG capsule Take 1 capsule  (500 mg total) by mouth 2 (two) times daily. Patient not taking: Reported on 10/10/2015 07/09/15   Benjiman Core, MD  diphenhydrAMINE (BENADRYL) 25 MG tablet Take 50 mg by mouth every 6 (six) hours as needed. Reported on 10/10/2015    Historical Provider, MD  meloxicam (MOBIC) 15 MG tablet Take 15 mg by mouth daily. 06/05/15   Historical Provider, MD  ondansetron (ZOFRAN) 4 MG tablet Take 1 tablet (4 mg total) by mouth every 6 (six) hours. 11/02/15   Servando Salina, NP  SUMAtriptan (IMITREX) 50 MG tablet Take 1 tablet (50 mg total) by mouth once. May repeat in 2 hours if headache persists or recurs.  Do not exceed 4 tablets in one day. 11/02/15   Servando Salina, NP  traZODone (DESYREL) 150 MG tablet Take 1 tablet (150 mg total) by mouth at bedtime as needed and may repeat dose one time if needed for sleep. 10/10/15   Oletta Darter, MD   Meds Ordered and Administered this Visit   Medications  SUMAtriptan (IMITREX) injection 6 mg (6 mg Subcutaneous Given 11/02/15 1702)  ketorolac (TORADOL) injection 60 mg (60 mg Intramuscular Given 11/02/15 1701)  ondansetron (ZOFRAN-ODT) disintegrating tablet 8 mg (8 mg Oral Given 11/02/15 1701)  diphenhydrAMINE (BENADRYL) injection 50 mg (50 mg Intramuscular Given 11/02/15 1701)    BP 130/85 mmHg  Pulse 60  Temp(Src) 98 F (36.7 C) (Oral)  Resp 18  SpO2 98% No data found.   Physical Exam  Constitutional: She is oriented to person, place, and time. She appears well-developed and well-nourished. No distress.  HENT:  Head: Normocephalic and atraumatic.  Eyes: Conjunctivae are normal. Pupils are equal, round, and reactive to light. Right eye exhibits no discharge. Left eye exhibits no discharge. No scleral icterus.  Neck: Normal range of motion. Neck supple. No thyromegaly present.  Negative for nuchal rigidity.  Cardiovascular: Normal rate, regular rhythm, normal heart sounds and intact distal pulses.  Exam reveals no gallop and no friction rub.   No  murmur heard. Pulmonary/Chest: Effort normal and breath sounds normal. No respiratory distress. She has no wheezes. She has no rales. She exhibits no tenderness.  Lymphadenopathy:    She has no cervical adenopathy.  Neurological: She is alert and oriented to person, place, and time. She displays no atrophy. No cranial nerve deficit or sensory deficit. She exhibits normal muscle tone. She displays no seizure activity. Coordination and gait normal.  Cranial nerves II through XII grossly intact. The patient is able to ambulate and step up and down from examination table without assistance. Heel toe gait and tandem walk intact. Negative Romberg. Strength 5/5 all 4 extremities.  Skin: Skin is warm and dry. She is not diaphoretic.  Nursing note and vitals reviewed.   ED Course  Procedures (including critical care time)  Labs Review Labs Reviewed - No data to display  Imaging Review No results found.   MDM   1. Migraine without aura and without  status migrainosus, not intractable    Meds ordered this encounter  Medications  . SUMAtriptan (IMITREX) injection 6 mg    Sig:   . ketorolac (TORADOL) injection 60 mg    Sig:   . ondansetron (ZOFRAN-ODT) disintegrating tablet 8 mg    Sig:   . diphenhydrAMINE (BENADRYL) injection 50 mg    Sig:   . SUMAtriptan (IMITREX) 50 MG tablet    Sig: Take 1 tablet (50 mg total) by mouth once. May repeat in 2 hours if headache persists or recurs.  Do not exceed 4 tablets in one day.    Dispense:  10 tablet    Refill:  0  . ondansetron (ZOFRAN) 4 MG tablet    Sig: Take 1 tablet (4 mg total) by mouth every 6 (six) hours.    Dispense:  12 tablet    Refill:  0   The patient is being driven home to go rest. The patient verbalizes understanding and agrees to plan of care.       Servando Salinaatherine H Rossi, NP 11/02/15 1730

## 2015-11-18 ENCOUNTER — Telehealth (HOSPITAL_COMMUNITY): Payer: Self-pay | Admitting: Licensed Clinical Social Worker

## 2015-12-19 ENCOUNTER — Ambulatory Visit (HOSPITAL_COMMUNITY): Payer: Self-pay | Admitting: Psychiatry

## 2016-01-08 ENCOUNTER — Ambulatory Visit (HOSPITAL_COMMUNITY)
Admission: EM | Admit: 2016-01-08 | Discharge: 2016-01-08 | Disposition: A | Discharge status: Home / Self Care | Payer: BLUE CROSS/BLUE SHIELD | Attending: Physician Assistant | Admitting: Physician Assistant

## 2016-01-08 ENCOUNTER — Encounter (HOSPITAL_COMMUNITY): Payer: Self-pay | Admitting: Emergency Medicine

## 2016-01-08 DIAGNOSIS — Z0000 Encounter for general adult medical examination without abnormal findings: Secondary | ICD-10-CM | POA: Insufficient documentation

## 2016-01-08 DIAGNOSIS — E039 Hypothyroidism, unspecified: Secondary | ICD-10-CM

## 2016-01-08 DIAGNOSIS — J0110 Acute frontal sinusitis, unspecified: Secondary | ICD-10-CM

## 2016-01-08 LAB — TSH: TSH: 0.023 u[IU]/mL — AB (ref 0.350–4.500)

## 2016-01-08 MED ORDER — FLUTICASONE PROPIONATE 50 MCG/ACT NA SUSP: 2.0000 | Freq: Every day | NASAL | 2 refills | Status: DC

## 2016-01-08 MED ORDER — AMOXICILLIN 500 MG PO CAPS: 500.0000 mg | ORAL_CAPSULE | Freq: Three times a day (TID) | ORAL | 0 refills | Status: DC

## 2016-01-08 NOTE — ED Triage Notes (Signed)
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 PT reports facial pain and pressure for 5-7 days. PT also wonders if she can get her thyroid checked.

## 2016-01-11 ENCOUNTER — Telehealth (HOSPITAL_COMMUNITY): Payer: Self-pay | Admitting: Emergency Medicine

## 2016-01-11 NOTE — Telephone Encounter (Signed)
Left voicemail message.

## 2016-01-11 NOTE — Telephone Encounter (Signed)
-----   Message from Eustace Moore, MD sent at 01/09/2016  1:35 PM EDT ----- Clinical staff, please let patient know that TSH was a little low, 0.023.   The TSH may just need to be rechecked in a few weeks, or the levothyroxine dose might need an adjustment down.   Patient should followup with PCP/Veita Parke Simmers to discuss further.  LM

## 2016-01-13 ENCOUNTER — Encounter (HOSPITAL_COMMUNITY): Payer: Self-pay | Admitting: Family Medicine

## 2016-01-13 ENCOUNTER — Ambulatory Visit (HOSPITAL_COMMUNITY)
Admission: EM | Admit: 2016-01-13 | Discharge: 2016-01-13 | Disposition: A | Discharge status: Home / Self Care | Payer: BLUE CROSS/BLUE SHIELD | Attending: Emergency Medicine | Admitting: Emergency Medicine

## 2016-01-13 DIAGNOSIS — J3489 Other specified disorders of nose and nasal sinuses: Secondary | ICD-10-CM | POA: Diagnosis not present

## 2016-01-13 MED ORDER — PREDNISONE 20 MG PO TABS: ORAL_TABLET | ORAL | 0 refills | Status: DC

## 2016-01-13 NOTE — ED Triage Notes (Signed)
Pt here for continued sinus pain after treatment for sinus infection a week ago. sts she was seen here. sts also that she was wanting to get her sleeping meds changed and maybe her thyroid checked. Pt is taking Amoxicillin for infection.

## 2016-01-13 NOTE — ED Provider Notes (Signed)
CSN: [id number]     Arrival date & time 01/13/16  1520 History   First MD Initiated Contact with Patient 01/13/16 1647     Chief Complaint  Patient presents with  . Facial Pain  . 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 She was seen in the urgent care here approximately 4 days ago and diagnosed with sinusitis and treated with amoxicillin and steroid nasal spray. Patient states she really does not feel any better. Denies fevers or chills. She was not prescribed or is taking other medications for symptoms.  She is also asking about advice regarding continuing her trazodone versus taking another medicine for sleep.      Past Medical History:  Diagnosis Date  . Alcohol abuse   . Allergy   . Anxiety   . Depression   . HSV (herpes simplex virus) infection   . Hyperlipidemia   . Hypertension   . Insomnia   . Unspecified hypothyroidism    Graves   Past Surgical History:  Procedure Laterality Date  . COLPOSCOPY    . COSMETIC SURGERY     breast implants saline   Family History  Problem Relation Age of Onset  . Stroke Mother   . Leukemia Father   . Alcohol abuse Father   . Depression Sister   . Alcohol abuse Sister   . Bipolar disorder Sister   . Depression Sister   . Depression Sister   . Alcohol abuse Sister   . Depression Sister   . Heart disease Other   . Hyperlipidemia Other   . Diabetes Other   . Alcohol abuse Maternal Uncle   . Alcohol abuse Paternal Uncle   . Alcohol abuse Paternal Grandfather    Social History  Substance Use Topics  . Smoking status: Former Smoker    Packs/day: 1.00    Quit date: 06/08/2006  . Smokeless tobacco: Never Used  . Alcohol use 54.0 oz/week    90 Glasses of wine per week     Comment: Per previous documentation: 90 glasses of wine per week; pt now  states only occasional alcohol use 11/02/15   OB History    No data available     Review of Systems  Constitutional: Negative.        Noted in HPI otherwise Negative  HENT: Positive for congestion and sinus pressure. Negative for ear pain, postnasal drip and sore throat.        Noted in HPI, otherwise negative Pressure across the mid forehead and paranasal sinuses.  Eyes: Negative.  Negative for pain.  Respiratory: Positive for cough. Negative for shortness of breath.   Cardiovascular: Negative for chest pain, palpitations and leg swelling.  Gastrointestinal: Negative.   Musculoskeletal: Negative.   Skin: Negative for rash.  Neurological: Negative.   Psychiatric/Behavioral: Negative.   All other systems reviewed and are negative.   Allergies  Lunesta [eszopiclone] and Ambien [zolpidem tartrate]  Home Medications   Prior to Admission medications   Medication Sig Start Date End Date Taking? Authorizing Provider  acetaminophen (TYLENOL) 500 MG tablet Take 1,000 mg by mouth every 6 (six) hours as needed for moderate pain or headache.    Historical Provider, MD  acyclovir (ZOVIRAX) 200 MG capsule Take 1 capsule (200 mg total) by mouth 2 (two) times daily. 09/26/14   Quentin MullingAmanda Collier, PA-C  amoxicillin (AMOXIL) 500 MG capsule Take 1 capsule (500 mg  total) by mouth 3 (three) times daily. 01/08/16   Tharon Aquas, PA  diphenhydrAMINE (BENADRYL) 25 MG tablet Take 50 mg by mouth every 6 (six) hours as needed. Reported on 10/10/2015    Historical Provider, MD  escitalopram (LEXAPRO) 20 MG tablet Take 1 tablet (20 mg total) by mouth daily. 10/10/15 10/10/17  Oletta Darter, MD  fluticasone (FLONASE) 50 MCG/ACT nasal spray Place 2 sprays into both nostrils daily. 01/08/16   Tharon Aquas, PA  levothyroxine (SYNTHROID, LEVOTHROID) 88 MCG tablet Take 1 tablet (88 mcg total) by mouth daily. 04/02/15   Quentin Mulling, PA-C  meloxicam (MOBIC) 15 MG tablet Take 15 mg by mouth daily. 06/05/15   Historical  Provider, MD  ondansetron (ZOFRAN) 4 MG tablet Take 1 tablet (4 mg total) by mouth every 6 (six) hours. 11/02/15   Servando Salina, NP  predniSONE (DELTASONE) 20 MG tablet 3 Tabs PO Days 1-3, then 2 tabs PO Days 4-6, then 1 tab PO Day 7-9, then Half Tab PO Day 10-12 01/13/16   Hayden Rasmussen, NP  SUMAtriptan (IMITREX) 50 MG tablet Take 1 tablet (50 mg total) by mouth once. May repeat in 2 hours if headache persists or recurs.  Do not exceed 4 tablets in one day. 11/02/15   Servando Salina, NP  traZODone (DESYREL) 100 MG tablet Take 2 tablets (200 mg total) by mouth at bedtime. 10/24/15   Oletta Darter, MD  traZODone (DESYREL) 150 MG tablet Take 1 tablet (150 mg total) by mouth at bedtime as needed and may repeat dose one time if needed for sleep. 10/10/15   Oletta Darter, MD   Meds Ordered and Administered this Visit  Medications - No data to display  There were no vitals taken for this visit. No data found.   Physical Exam  Constitutional: She is oriented to person, place, and time. She appears well-developed and well-nourished. No distress.  HENT:  Right Ear: External ear normal.  Left Ear: External ear normal.  Bilateral TMs are normal. Oropharynx with minor clear PND otherwise clear. No exudates.  Pressure to the paranasal/medial maxillary sinuses increases discomfort.  Eyes: EOM are normal. Pupils are equal, round, and reactive to light.  Neck: Normal range of motion. Neck supple.  Cardiovascular: Normal rate, regular rhythm and normal heart sounds.   Pulmonary/Chest: Effort normal and breath sounds normal. No respiratory distress. She has no wheezes.  Musculoskeletal: Normal range of motion. She exhibits no edema.  Lymphadenopathy:    She has no cervical adenopathy.  Neurological: She is alert and oriented to person, place, and time.  Skin: Skin is warm and dry.  Nursing note and vitals reviewed.   Urgent Care Course   Clinical Course    Procedures (including critical care  time)  Labs Review Labs Reviewed - No data to display  Imaging Review No results found.   Visual Acuity Review  Right Eye Distance:   Left Eye Distance:   Bilateral Distance:    Right Eye Near:   Left Eye Near:    Bilateral Near:         MDM   1. Sinus pain    Meds ordered this encounter  Medications  . predniSONE (DELTASONE) 20 MG tablet    Sig: 3 Tabs PO Days 1-3, then 2 tabs PO Days 4-6, then 1 tab PO Day 7-9, then Half Tab PO Day 10-12    Dispense:  20 tablet    Refill:  0    Order Specific  Question:   Supervising Provider    Answer:   Charm Rings [[id number]]   Take Sudafed PE 10 mg every 4 hours as needed for congestion. Consider taking an antihistamine such as Allegra or Zyrtec daily Continue the steroid has full spray. Drink plenty fluids and stay well-hydrated.    Hayden Rasmussen, NP 01/13/16 [phone]

## 2016-01-14 NOTE — Progress Notes (Signed)
Patient came to ucc to be seen on 01/13/2016.  See medical record from this date of service.

## 2016-01-30 ENCOUNTER — Other Ambulatory Visit (HOSPITAL_COMMUNITY): Payer: Self-pay | Admitting: Psychiatry

## 2016-01-30 DIAGNOSIS — G4700 Insomnia, unspecified: Secondary | ICD-10-CM

## 2016-01-30 DIAGNOSIS — F4322 Adjustment disorder with anxiety: Secondary | ICD-10-CM

## 2016-02-20 ENCOUNTER — Other Ambulatory Visit: Payer: Self-pay | Admitting: Internal Medicine

## 2016-02-20 DIAGNOSIS — E0590 Thyrotoxicosis, unspecified without thyrotoxic crisis or storm: Secondary | ICD-10-CM

## 2016-02-26 ENCOUNTER — Ambulatory Visit (HOSPITAL_COMMUNITY)
Admission: RE | Admit: 2016-02-26 | Discharge: 2016-02-26 | Disposition: A | Discharge status: Home / Self Care | Payer: BLUE CROSS/BLUE SHIELD | Source: Ambulatory Visit | Attending: Internal Medicine | Admitting: Internal Medicine

## 2016-02-26 DIAGNOSIS — E049 Nontoxic goiter, unspecified: Secondary | ICD-10-CM | POA: Diagnosis not present

## 2016-02-26 DIAGNOSIS — E0590 Thyrotoxicosis, unspecified without thyrotoxic crisis or storm: Secondary | ICD-10-CM

## 2016-05-02 ENCOUNTER — Other Ambulatory Visit (HOSPITAL_COMMUNITY): Payer: Self-pay | Admitting: Medical

## 2016-05-02 DIAGNOSIS — F4322 Adjustment disorder with anxiety: Secondary | ICD-10-CM

## 2016-05-11 NOTE — Telephone Encounter (Signed)
Medication refill request - Fax received requesting a Lexapro refill.  Pharmacy reports patient filled on 01/06/16 and 04/01/16.  No current appointment scheduled.

## 2016-05-11 NOTE — Telephone Encounter (Signed)
Needs to be seen

## 2016-09-14 ENCOUNTER — Telehealth: Payer: Self-pay | Admitting: Obstetrics and Gynecology

## 2016-09-14 NOTE — Telephone Encounter (Signed)
Called and left a message for patient to call back to schedule a new patient doctor referral. °

## 2016-09-15 NOTE — Telephone Encounter (Signed)
Called and left a message for patient to call back to schedule a new patient doctor referral. °

## 2016-09-17 NOTE — Telephone Encounter (Signed)
Called and left a message for patient to call back to schedule a new patient doctor referral. °

## 2016-09-28 NOTE — Telephone Encounter (Signed)
Routing referral back to referring office. Patient has not returned multiple calls to schedule an appointment. ° °

## 2016-10-11 ENCOUNTER — Encounter (HOSPITAL_COMMUNITY): Payer: Self-pay

## 2016-10-11 ENCOUNTER — Ambulatory Visit (HOSPITAL_COMMUNITY)
Admission: EM | Admit: 2016-10-11 | Discharge: 2016-10-11 | Disposition: A | Discharge status: Home / Self Care | Payer: BLUE CROSS/BLUE SHIELD | Attending: Internal Medicine | Admitting: Internal Medicine

## 2016-10-11 DIAGNOSIS — E785 Hyperlipidemia, unspecified: Secondary | ICD-10-CM | POA: Diagnosis not present

## 2016-10-11 DIAGNOSIS — E039 Hypothyroidism, unspecified: Secondary | ICD-10-CM | POA: Insufficient documentation

## 2016-10-11 DIAGNOSIS — Z87891 Personal history of nicotine dependence: Secondary | ICD-10-CM | POA: Insufficient documentation

## 2016-10-11 DIAGNOSIS — I10 Essential (primary) hypertension: Secondary | ICD-10-CM | POA: Diagnosis not present

## 2016-10-11 DIAGNOSIS — G43019 Migraine without aura, intractable, without status migrainosus: Secondary | ICD-10-CM | POA: Diagnosis not present

## 2016-10-11 DIAGNOSIS — F419 Anxiety disorder, unspecified: Secondary | ICD-10-CM | POA: Diagnosis not present

## 2016-10-11 DIAGNOSIS — Z79899 Other long term (current) drug therapy: Secondary | ICD-10-CM | POA: Diagnosis not present

## 2016-10-11 DIAGNOSIS — G43919 Migraine, unspecified, intractable, without status migrainosus: Secondary | ICD-10-CM | POA: Diagnosis present

## 2016-10-11 DIAGNOSIS — F329 Major depressive disorder, single episode, unspecified: Secondary | ICD-10-CM | POA: Insufficient documentation

## 2016-10-11 DIAGNOSIS — J029 Acute pharyngitis, unspecified: Secondary | ICD-10-CM | POA: Insufficient documentation

## 2016-10-11 DIAGNOSIS — R110 Nausea: Secondary | ICD-10-CM | POA: Diagnosis not present

## 2016-10-11 LAB — POCT RAPID STREP A: STREPTOCOCCUS, GROUP A SCREEN (DIRECT): NEGATIVE

## 2016-10-11 MED ORDER — SUMATRIPTAN SUCCINATE 100 MG PO TABS: 100.0000 mg | ORAL_TABLET | Freq: Once | ORAL | 0 refills | Status: DC

## 2016-10-11 MED ORDER — ONDANSETRON HCL 4 MG/2ML IJ SOLN
4.0000 mg | Freq: Once | INTRAMUSCULAR | Status: AC
  Administered 2016-10-11: 4 mg via INTRAMUSCULAR

## 2016-10-11 MED ORDER — ONDANSETRON HCL 4 MG/2ML IJ SOLN
INTRAMUSCULAR | Status: AC
  Filled 2016-10-11: qty 2

## 2016-10-11 MED ORDER — KETOROLAC TROMETHAMINE 60 MG/2ML IM SOLN
INTRAMUSCULAR | Status: AC
  Filled 2016-10-11: qty 2

## 2016-10-11 MED ORDER — KETOROLAC TROMETHAMINE 60 MG/2ML IM SOLN
60.0000 mg | Freq: Once | INTRAMUSCULAR | Status: AC
  Administered 2016-10-11: 60 mg via INTRAMUSCULAR

## 2016-10-11 MED ORDER — ONDANSETRON HCL 8 MG PO TABS: 8.0000 mg | ORAL_TABLET | Freq: Three times a day (TID) | ORAL | 0 refills | Status: DC | PRN

## 2016-10-11 NOTE — ED Triage Notes (Signed)
Pt having migraine for 2 days. Said she has a hx of migraines. Productive cough as well. No otc medications taken

## 2016-10-11 NOTE — ED Provider Notes (Signed)
CSN: [id number]     Arrival date & time 10/11/16  1329 History   First MD Initiated Contact with Patient 10/11/16 1542     Chief Complaint  Patient presents with  . Migraine   (Consider location/radiation/quality/duration/timing/severity/associated sxs/prior Treatment) 56 year old female presents with migraine headache that has been on and off for about one week and has gotten worse and more constant in the past 2 days. Pain is mainly frontal and temporal areas. Also experiencing some photophobia and nausea. Also started with a slight cough today and sore throat. Sister has strep and concerned headache may be a result of infection. Has taken Mobic and Sudafed with minimal relief. Has history of migraine headaches- was seen here about 1 year ago for migraine and was given Toradol, Imitrex and Zofran with success. 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  Also has history of thyroid disorder and insomnia and has had difficulty sleeping lately.    The history is provided by the patient.    Past Medical History:  Diagnosis Date  . Alcohol abuse   . Allergy   . Anxiety   . Depression   . HSV (herpes simplex virus) infection   . Hyperlipidemia   . Hypertension   . Insomnia   . Unspecified hypothyroidism    Graves   Past Surgical History:  Procedure Laterality Date  . COLPOSCOPY    . COSMETIC SURGERY     breast implants saline   Family History  Problem Relation Age of Onset  . Stroke Mother   . Leukemia Father   . Alcohol abuse Father   . Depression Sister   . Alcohol abuse Sister   . Bipolar disorder Sister   . Depression Sister   . Depression Sister   . Alcohol abuse Sister   . Depression Sister   . Heart disease Other   . Hyperlipidemia Other   . Diabetes Other   . Alcohol abuse Maternal Uncle   . Alcohol abuse Paternal Uncle   . Alcohol abuse Paternal Grandfather    Social History  Substance Use Topics  .  Smoking status: Former Smoker    Packs/day: 1.00    Quit date: 06/08/2006  . Smokeless tobacco: Never Used  . Alcohol use 54.0 oz/week    90 Glasses of wine per week     Comment: Per previous documentation: 90 glasses of wine per week; pt now states only occasional alcohol use 11/02/15   OB History    No data available     Review of Systems  Constitutional: Positive for appetite change and fatigue. Negative for chills and fever.  HENT: Positive for sore throat. Negative for congestion, ear discharge, ear pain, postnasal drip and sinus pressure.   Eyes: Positive for photophobia. Negative for discharge and visual disturbance.  Respiratory: Positive for cough. Negative for chest tightness, shortness of breath and wheezing.   Cardiovascular: Negative for chest pain.  Gastrointestinal: Positive for nausea. Negative for abdominal pain, diarrhea and vomiting.  Musculoskeletal: Negative for arthralgias, myalgias, neck pain and neck stiffness.  Skin: Negative for rash and wound.  Neurological: Positive for headaches. Negative for dizziness, tremors, seizures, syncope, facial asymmetry, speech difficulty, weakness, light-headedness and numbness.  Hematological: Negative for adenopathy.    Allergies  Lunesta [eszopiclone] and Ambien [zolpidem tartrate]  Home Medications   Prior to Admission medications   Medication Sig Start Date End Date Taking? Authorizing Provider  acyclovir (ZOVIRAX) 200 MG  capsule Take 1 capsule (200 mg total) by mouth 2 (two) times daily. 09/26/14  Yes Quentin Mulling, PA-C  escitalopram (LEXAPRO) 20 MG tablet Take 1 tablet (20 mg total) by mouth daily. 10/10/15 10/10/17 Yes Oletta Darter, MD  fluticasone [facility]) 50 MCG/ACT nasal spray Place 2 sprays into both nostrils daily. 01/08/16  Yes Tharon Aquas, PA  levothyroxine (SYNTHROID, LEVOTHROID) 88 MCG tablet Take 1 tablet (88 mcg total) by mouth daily. 04/02/15  Yes Quentin Mulling, PA-C  meloxicam (MOBIC) 15 MG tablet  Take 15 mg by mouth daily. 06/05/15  Yes [provider]  traZODone (DESYREL) 100 MG tablet Take 2 tablets (200 mg total) by mouth at bedtime. 10/24/15  Yes Oletta Darter, MD  acetaminophen (TYLENOL) 500 MG tablet Take 1,000 mg by mouth every 6 (six) hours as needed for moderate pain or headache.    [provider]  ondansetron (ZOFRAN) 8 MG tablet Take 1 tablet (8 mg total) by mouth every 8 (eight) hours as needed for nausea. 10/11/16   Sudie Grumbling, NP  SUMAtriptan (IMITREX) 100 MG tablet Take 1 tablet (100 mg total) by mouth once. May repeat in 2 hours if headache persists or recurs. 10/11/16 10/11/16  Sudie Grumbling, NP  traZODone (DESYREL) 150 MG tablet Take 1 tablet (150 mg total) by mouth at bedtime as needed and may repeat dose one time if needed for sleep. 10/10/15   Oletta Darter, MD   Meds Ordered and Administered this Visit   Medications  ketorolac (TORADOL) injection 60 mg (60 mg Intramuscular Given 10/11/16 1648)  ondansetron (ZOFRAN) injection 4 mg (4 mg Intramuscular Given 10/11/16 1649)    BP (!) 137/97 (BP Location: Left Arm)   Pulse 95   Temp 98.6 F (37 C) (Oral)   Resp 20   SpO2 100%  No data found.   Physical Exam  Constitutional: She is oriented to person, place, and time. She appears well-developed and well-nourished. No distress.  Patient resting comfortably with lights off in exam room.   HENT:  Head: Normocephalic and atraumatic.  Right Ear: Hearing, tympanic membrane, external ear and ear canal normal.  Left Ear: Hearing, tympanic membrane, external ear and ear canal normal.  Nose: No mucosal edema or rhinorrhea. Right sinus exhibits frontal sinus tenderness. Right sinus exhibits no maxillary sinus tenderness. Left sinus exhibits frontal sinus tenderness. Left sinus exhibits no maxillary sinus tenderness.  Mouth/Throat: Uvula is midline and mucous membranes are normal. Posterior oropharyngeal erythema present.  Neck: Normal range of motion.  Neck supple.  Cardiovascular: Normal rate, regular rhythm and normal heart sounds.   No murmur heard. Pulmonary/Chest: Effort normal and breath sounds normal. No respiratory distress.  Musculoskeletal: Normal range of motion.  Lymphadenopathy:    She has no cervical adenopathy.  Neurological: She is alert and oriented to person, place, and time. She has normal strength and normal reflexes. No cranial nerve deficit or sensory deficit. She displays a negative Romberg sign.  Skin: Skin is warm, dry and intact. No rash noted.  Psychiatric: She has a normal mood and affect. Her behavior is normal.    Urgent Care Course     Procedures (including critical care time)  Labs Review Labs Reviewed  CULTURE, GROUP A STREP [facility])  POCT RAPID STREP A    Imaging Review No results found.   Visual Acuity Review  Right Eye Distance:   Left Eye Distance:   Bilateral Distance:    Right Eye Near:   Left Eye Near:  Bilateral Near:         MDM   1. Intractable migraine without aura and without status migrainosus   2. Pharyngitis, unspecified etiology    Reviewed negative rapid strep result with patient. Discussed various treatment options for probable migraine headache. Gave Toradol 60mg  IM today for pain and Zofran 4mg  IM for nausea. If headache persists this evening, may try Imitrex 100mg  once- may repeat in 2 hours if needed. May also take Zofran 8mg  every 8 hours as needed for nausea. Continue to monitor symptoms. Note written for work.  Recommend follow-up with her primary care provider in 2 to 3 days if not resolving or go to ER if pain worsens.     Sudie GrumblingAmyot, Margret Moat Berry, NP 10/12/16 1226

## 2016-10-11 NOTE — Discharge Instructions (Signed)
You were given a shot of Toradol today to help with pain. You were also given a shot of Zofran for nausea. This evening, you may take Imitrex 100mg  tablet if headache persists. You may also take Zofran 8mg  tablet in 6 to 8 hours for nausea. Recommend follow-up with your primary care provider in 2 to 3 days if not resolving or go to ER if pain persists or worsens.

## 2016-10-14 LAB — CULTURE, GROUP A STREP (THRC)

## 2016-10-27 ENCOUNTER — Ambulatory Visit
Admission: RE | Admit: 2016-10-27 | Discharge: 2016-10-27 | Disposition: A | Discharge status: Home / Self Care | Payer: BLUE CROSS/BLUE SHIELD | Source: Ambulatory Visit | Attending: Internal Medicine | Admitting: Internal Medicine

## 2016-10-27 ENCOUNTER — Other Ambulatory Visit: Payer: Self-pay | Admitting: Internal Medicine

## 2016-10-27 DIAGNOSIS — R519 Headache, unspecified: Secondary | ICD-10-CM

## 2016-10-27 DIAGNOSIS — R51 Headache: Principal | ICD-10-CM

## 2016-10-29 ENCOUNTER — Other Ambulatory Visit: Payer: Self-pay

## 2017-02-24 ENCOUNTER — Other Ambulatory Visit: Payer: Self-pay | Admitting: Internal Medicine

## 2017-02-24 DIAGNOSIS — G43509 Persistent migraine aura without cerebral infarction, not intractable, without status migrainosus: Secondary | ICD-10-CM

## 2017-02-25 ENCOUNTER — Ambulatory Visit
Admission: RE | Admit: 2017-02-25 | Discharge: 2017-02-25 | Disposition: A | Discharge status: Home / Self Care | Payer: BLUE CROSS/BLUE SHIELD | Source: Ambulatory Visit | Attending: Internal Medicine | Admitting: Internal Medicine

## 2017-02-25 DIAGNOSIS — G43509 Persistent migraine aura without cerebral infarction, not intractable, without status migrainosus: Secondary | ICD-10-CM

## 2017-08-14 ENCOUNTER — Encounter (HOSPITAL_COMMUNITY): Payer: Self-pay | Admitting: *Deleted

## 2017-08-14 ENCOUNTER — Other Ambulatory Visit: Payer: Self-pay

## 2017-08-14 ENCOUNTER — Ambulatory Visit (INDEPENDENT_AMBULATORY_CARE_PROVIDER_SITE_OTHER): Payer: BLUE CROSS/BLUE SHIELD

## 2017-08-14 ENCOUNTER — Ambulatory Visit (HOSPITAL_COMMUNITY)
Admission: EM | Admit: 2017-08-14 | Discharge: 2017-08-14 | Disposition: A | Discharge status: Home / Self Care | Payer: BLUE CROSS/BLUE SHIELD | Attending: Family Medicine | Admitting: Family Medicine

## 2017-08-14 DIAGNOSIS — S52502A Unspecified fracture of the lower end of left radius, initial encounter for closed fracture: Secondary | ICD-10-CM | POA: Diagnosis not present

## 2017-08-14 MED ORDER — TRAMADOL HCL 50 MG PO TABS: 50.0000 mg | ORAL_TABLET | Freq: Four times a day (QID) | ORAL | 0 refills | Status: DC | PRN

## 2017-08-14 MED ORDER — METOCLOPRAMIDE HCL 5 MG/ML IJ SOLN
INTRAMUSCULAR | Status: AC
  Filled 2017-08-14: qty 2

## 2017-08-14 NOTE — Discharge Instructions (Signed)
Ice/cold pack over area for 10-15 min twice daily.  Ibuprofen 400-600 mg (2-3 over the counter strength tabs) every 6 hours as needed for pain.  OK to take Tylenol 1000 mg (2 extra strength tabs) or 975 mg (3 regular strength tabs) every 6 hours as needed.

## 2017-08-14 NOTE — ED Notes (Signed)
Ortho tech paged  

## 2017-08-14 NOTE — ED Provider Notes (Signed)
  [facility]    CSN: [id number] Arrival date & time: 08/14/17  1225   Musculoskeletal Exam  Patient: Stacy [name] DOB: 08/12/1960  DOS: 08/14/2017  SUBJECTIVE:  Chief Complaint:   Chief Complaint  Patient presents with  . Wrist Pain    Stacy [name] is a 57 y.o.  female for evaluation and treatment of L wrist pain.   Onset:  10 days ago after a fall.  Location: L wrist Character:  aching and sharp  Progression of issue:  is unchanged Associated symptoms: swelling/bruising Treatment: to date has been OTC NSAIDS.   Neurovascular symptoms: no  ROS: Musculoskeletal/Extremities: +L wrist pain  Past Medical History:  Diagnosis Date  . Alcohol abuse   . Allergy   . Anxiety   . Depression   . HSV (herpes simplex virus) infection   . Hyperlipidemia   . Hypertension   . Insomnia   . Unspecified hypothyroidism    Graves    Objective: VITAL SIGNS: BP (!) 152/110 (BP Location: Left Arm)   Pulse 92   Temp 97.6 F (36.4 C) (Oral)   SpO2 100%  Constitutional: Well formed, well developed. No acute distress. Cardiovascular: Brisk cap refill Thorax & Lungs: No accessory muscle use Musculoskeletal: L wrist.   Normal active range of motion: no.   Normal passive range of motion: no Tenderness to palpation: yes Deformity: yes Ecchymosis: yes Neurologic: Normal sensory function and coordination of fingers.  Psychiatric: Normal mood. Age appropriate judgment and insight. Alert & oriented x 3.    DG L wrist complete IMPRESSION: Acute minimally displaced and slightly impacted fracture of the distal radius with extension to involve the distal radioulnar joint.  Electronically Signed   By: Simonne ComeJohn  Watts M.D.   On: 08/14/2017 14:09  Assessment:  Closed fracture of distal end of left radius, unspecified fracture morphology, initial encounter  Plan: Sugar tong splint, ibuprofen, Tylenol, ice, Tramadol for breakthrough pain. She did request a letter for  her employer stating she was seen and treated here on 08/14/17, return to work will be based on the specialist's guidance, and the date of the injury. She did not know the exact date and was unable to find it before she left. She would like to call in to have this done. I was planning on writing in the note: "Stacy [name] was seen at the urgent care on 08/14/17 and treated. The date of her injury is (whatever she calls back stating it happened on). Her return to work will be based on the recommendation of her specialist." Hopefully another provider can fill in the blanks or route it to me.  F/u with orthopedic team per our referral. The patient voiced understanding and agreement to the plan.    Sharlene DoryWendling, Nivea Wojdyla Paul, [place] 08/14/17 1530

## 2017-08-14 NOTE — ED Triage Notes (Addendum)
Left wrist pain, per pt she had a bad fall and she thinks it was 10 days ago, per pt she put ice on it and thought she would be okay, visibly swollen and discolored, per pt she's been taking ibuprofen

## 2017-08-14 NOTE — ED Notes (Signed)
Ortho tech, Victorino DikeJennifer, notified of need for splint.

## 2017-08-14 NOTE — Progress Notes (Signed)
Orthopedic Tech Progress Note Patient Details:  Stacy HerbertRosaleen M [name] 02/16/1961 [id number]  Ortho Devices Type of Ortho Device: Sugartong splint, Volar splint, Arm sling Ortho Device/Splint Location: Refused sugartong splint.  applied volar splint Ortho Device/Splint Interventions: Application   Post Interventions Patient Tolerated: Refused intervention Instructions Provided: Care of device   Saul FordyceJennifer C Oswin Griffith 08/14/2017, 3:46 PM

## 2017-08-17 ENCOUNTER — Other Ambulatory Visit: Payer: Self-pay

## 2017-08-17 ENCOUNTER — Encounter (HOSPITAL_BASED_OUTPATIENT_CLINIC_OR_DEPARTMENT_OTHER): Payer: Self-pay | Admitting: *Deleted

## 2017-08-17 ENCOUNTER — Other Ambulatory Visit: Payer: Self-pay | Admitting: Orthopedic Surgery

## 2017-08-19 ENCOUNTER — Ambulatory Visit (HOSPITAL_BASED_OUTPATIENT_CLINIC_OR_DEPARTMENT_OTHER): Payer: BLUE CROSS/BLUE SHIELD | Admitting: Anesthesiology

## 2017-08-19 ENCOUNTER — Other Ambulatory Visit: Payer: Self-pay

## 2017-08-19 ENCOUNTER — Encounter (HOSPITAL_BASED_OUTPATIENT_CLINIC_OR_DEPARTMENT_OTHER): Payer: Self-pay | Admitting: Certified Registered"

## 2017-08-19 ENCOUNTER — Encounter (HOSPITAL_BASED_OUTPATIENT_CLINIC_OR_DEPARTMENT_OTHER)
Admission: RE | Disposition: A | Discharge status: Home / Self Care | Payer: Self-pay | Source: Ambulatory Visit | Attending: Orthopedic Surgery

## 2017-08-19 ENCOUNTER — Ambulatory Visit (HOSPITAL_BASED_OUTPATIENT_CLINIC_OR_DEPARTMENT_OTHER)
Admission: RE | Admit: 2017-08-19 | Discharge: 2017-08-19 | Disposition: A | Discharge status: Home / Self Care | Payer: BLUE CROSS/BLUE SHIELD | Source: Ambulatory Visit | Attending: Orthopedic Surgery | Admitting: Orthopedic Surgery

## 2017-08-19 DIAGNOSIS — Z87891 Personal history of nicotine dependence: Secondary | ICD-10-CM | POA: Insufficient documentation

## 2017-08-19 DIAGNOSIS — W1849XA Other slipping, tripping and stumbling without falling, initial encounter: Secondary | ICD-10-CM | POA: Diagnosis not present

## 2017-08-19 DIAGNOSIS — E039 Hypothyroidism, unspecified: Secondary | ICD-10-CM | POA: Diagnosis not present

## 2017-08-19 DIAGNOSIS — G4700 Insomnia, unspecified: Secondary | ICD-10-CM | POA: Insufficient documentation

## 2017-08-19 DIAGNOSIS — Z79890 Hormone replacement therapy (postmenopausal): Secondary | ICD-10-CM | POA: Insufficient documentation

## 2017-08-19 DIAGNOSIS — S52552A Other extraarticular fracture of lower end of left radius, initial encounter for closed fracture: Secondary | ICD-10-CM | POA: Insufficient documentation

## 2017-08-19 DIAGNOSIS — B009 Herpesviral infection, unspecified: Secondary | ICD-10-CM | POA: Diagnosis not present

## 2017-08-19 DIAGNOSIS — Z79899 Other long term (current) drug therapy: Secondary | ICD-10-CM | POA: Diagnosis not present

## 2017-08-19 HISTORY — PX: OPEN REDUCTION INTERNAL FIXATION (ORIF) DISTAL RADIAL FRACTURE: SHX5989

## 2017-08-19 HISTORY — DX: Unspecified fracture of the lower end of left radius, initial encounter for closed fracture: S52.502A

## 2017-08-19 SURGERY — OPEN REDUCTION INTERNAL FIXATION (ORIF) DISTAL RADIUS FRACTURE
Anesthesia: Monitor Anesthesia Care | Site: Wrist | Laterality: Left

## 2017-08-19 MED ORDER — MIDAZOLAM HCL 2 MG/2ML IJ SOLN
INTRAMUSCULAR | Status: AC
  Filled 2017-08-19: qty 2

## 2017-08-19 MED ORDER — LACTATED RINGERS IV SOLN
INTRAVENOUS | Status: DC
  Administered 2017-08-19: 09:00:00 via INTRAVENOUS

## 2017-08-19 MED ORDER — FENTANYL CITRATE (PF) 100 MCG/2ML IJ SOLN: 25.0000 ug | INTRAMUSCULAR | Status: DC | PRN

## 2017-08-19 MED ORDER — HYDROCODONE-ACETAMINOPHEN 5-325 MG PO TABS: ORAL_TABLET | ORAL | 0 refills | Status: DC

## 2017-08-19 MED ORDER — FENTANYL CITRATE (PF) 100 MCG/2ML IJ SOLN
INTRAMUSCULAR | Status: AC
  Filled 2017-08-19: qty 2

## 2017-08-19 MED ORDER — ROPIVACAINE HCL 7.5 MG/ML IJ SOLN
INTRAMUSCULAR | Status: DC | PRN
  Administered 2017-08-19: 20 mL via PERINEURAL

## 2017-08-19 MED ORDER — MIDAZOLAM HCL 2 MG/2ML IJ SOLN
1.0000 mg | INTRAMUSCULAR | Status: DC | PRN
  Administered 2017-08-19: 2 mg via INTRAVENOUS

## 2017-08-19 MED ORDER — FENTANYL CITRATE (PF) 100 MCG/2ML IJ SOLN
50.0000 ug | INTRAMUSCULAR | Status: DC | PRN
  Administered 2017-08-19: 50 ug via INTRAVENOUS

## 2017-08-19 MED ORDER — LIDOCAINE HCL (CARDIAC) 20 MG/ML IV SOLN
INTRAVENOUS | Status: DC | PRN
  Administered 2017-08-19: 30 mg via INTRAVENOUS

## 2017-08-19 MED ORDER — ONDANSETRON HCL 4 MG/2ML IJ SOLN
INTRAMUSCULAR | Status: DC | PRN
  Administered 2017-08-19: 4 mg via INTRAVENOUS

## 2017-08-19 MED ORDER — SCOPOLAMINE 1 MG/3DAYS TD PT72: 1.0000 | MEDICATED_PATCH | Freq: Once | TRANSDERMAL | Status: DC | PRN

## 2017-08-19 MED ORDER — ONDANSETRON HCL 4 MG/2ML IJ SOLN: 4.0000 mg | Freq: Once | INTRAMUSCULAR | Status: DC | PRN

## 2017-08-19 MED ORDER — CEFAZOLIN SODIUM-DEXTROSE 2-4 GM/100ML-% IV SOLN
2.0000 g | INTRAVENOUS | Status: AC
  Administered 2017-08-19: 2 g via INTRAVENOUS

## 2017-08-19 MED ORDER — CEFAZOLIN SODIUM-DEXTROSE 2-4 GM/100ML-% IV SOLN
INTRAVENOUS | Status: AC
  Filled 2017-08-19: qty 100

## 2017-08-19 MED ORDER — CHLORHEXIDINE GLUCONATE 4 % EX LIQD: 60.0000 mL | Freq: Once | CUTANEOUS | Status: DC

## 2017-08-19 MED ORDER — PROPOFOL 500 MG/50ML IV EMUL
INTRAVENOUS | Status: DC | PRN
  Administered 2017-08-19: 75 ug/kg/min via INTRAVENOUS

## 2017-08-19 SURGICAL SUPPLY — 63 items
BANDAGE ACE 3X5.8 VEL STRL LF (GAUZE/BANDAGES/DRESSINGS) ×3 IMPLANT
BIT DRILL 2.0 LNG QUCK RELEASE (BIT) IMPLANT
BIT DRILL 2.8X5 QR DISP (BIT) ×2 IMPLANT
BLADE SURG 15 STRL LF DISP TIS (BLADE) ×2 IMPLANT
BLADE SURG 15 STRL SS (BLADE) ×6
BNDG CMPR 9X4 STRL LF SNTH (GAUZE/BANDAGES/DRESSINGS) ×1
BNDG ESMARK 4X9 LF (GAUZE/BANDAGES/DRESSINGS) ×3 IMPLANT
BNDG GAUZE ELAST 4 BULKY (GAUZE/BANDAGES/DRESSINGS) ×3 IMPLANT
BNDG PLASTER X FAST 3X3 WHT LF (CAST SUPPLIES) ×30 IMPLANT
BNDG PLSTR 9X3 FST ST WHT (CAST SUPPLIES) ×10
CHLORAPREP W/TINT 26ML (MISCELLANEOUS) ×3 IMPLANT
CORD BIPOLAR FORCEPS 12FT (ELECTRODE) ×3 IMPLANT
COVER BACK TABLE 60X90IN (DRAPES) ×3 IMPLANT
COVER MAYO STAND STRL (DRAPES) ×3 IMPLANT
CUFF TOURNIQUET SINGLE 18IN (TOURNIQUET CUFF) ×2 IMPLANT
CUFF TOURNIQUET SINGLE 24IN (TOURNIQUET CUFF) IMPLANT
DRAPE EXTREMITY T 121X128X90 (DRAPE) ×3 IMPLANT
DRAPE OEC MINIVIEW 54X84 (DRAPES) ×3 IMPLANT
DRAPE SURG 17X23 STRL (DRAPES) ×5 IMPLANT
DRILL 2.0 LNG QUICK RELEASE (BIT) ×3
GAUZE SPONGE 4X4 12PLY STRL (GAUZE/BANDAGES/DRESSINGS) ×3 IMPLANT
GAUZE XEROFORM 1X8 LF (GAUZE/BANDAGES/DRESSINGS) ×3 IMPLANT
GLOVE BIO SURGEON STRL SZ7.5 (GLOVE) ×3 IMPLANT
GLOVE BIOGEL PI IND STRL 7.0 (GLOVE) IMPLANT
GLOVE BIOGEL PI IND STRL 8 (GLOVE) ×1 IMPLANT
GLOVE BIOGEL PI IND STRL 8.5 (GLOVE) IMPLANT
GLOVE BIOGEL PI INDICATOR 7.0 (GLOVE) ×2
GLOVE BIOGEL PI INDICATOR 8 (GLOVE) ×2
GLOVE BIOGEL PI INDICATOR 8.5 (GLOVE) ×2
GLOVE ECLIPSE 7.0 STRL STRAW (GLOVE) ×4 IMPLANT
GLOVE SURG ORTHO 8.0 STRL STRW (GLOVE) ×2 IMPLANT
GOWN STRL REUS W/ TWL LRG LVL3 (GOWN DISPOSABLE) ×1 IMPLANT
GOWN STRL REUS W/TWL LRG LVL3 (GOWN DISPOSABLE) ×3
GOWN STRL REUS W/TWL XL LVL3 (GOWN DISPOSABLE) ×5 IMPLANT
GUIDEWIRE ORTHO 0.054X6 (WIRE) ×6 IMPLANT
NDL HYPO 25X1 1.5 SAFETY (NEEDLE) IMPLANT
NEEDLE HYPO 25X1 1.5 SAFETY (NEEDLE) IMPLANT
NS IRRIG 1000ML POUR BTL (IV SOLUTION) ×3 IMPLANT
PACK BASIN DAY SURGERY FS (CUSTOM PROCEDURE TRAY) ×3 IMPLANT
PAD CAST 3X4 CTTN HI CHSV (CAST SUPPLIES) ×1 IMPLANT
PADDING CAST COTTON 3X4 STRL (CAST SUPPLIES) ×3
PLATE PROX NARROW LEFT (Plate) ×2 IMPLANT
SCREW ACTK 2 NL HEX 3.5.11 (Screw) ×4 IMPLANT
SCREW CORT FT 18X2.3XLCK HEX (Screw) IMPLANT
SCREW CORT FT 20X2.3XLCK HEX (Screw) IMPLANT
SCREW CORT FX14X2.3XLCK NS (Screw) IMPLANT
SCREW CORTICAL LOCKING 2.3X14M (Screw) ×3 IMPLANT
SCREW CORTICAL LOCKING 2.3X16M (Screw) ×6 IMPLANT
SCREW CORTICAL LOCKING 2.3X18M (Screw) ×6 IMPLANT
SCREW CORTICAL LOCKING 2.3X20M (Screw) ×3 IMPLANT
SCREW FX16X2.3XLCK SMTH NS CRT (Screw) IMPLANT
SCREW FX18X2.3XSMTH LCK NS CRT (Screw) IMPLANT
SCREW NONLOCK HEX 3.5X12 (Screw) ×2 IMPLANT
SLEEVE SCD COMPRESS KNEE MED (MISCELLANEOUS) IMPLANT
SLING ARM MED ADULT FOAM STRAP (SOFTGOODS) ×2 IMPLANT
STOCKINETTE 4X48 STRL (DRAPES) ×3 IMPLANT
SUT ETHILON 4 0 PS 2 18 (SUTURE) ×3 IMPLANT
SUT VICRYL 4-0 PS2 18IN ABS (SUTURE) ×3 IMPLANT
SYR BULB 3OZ (MISCELLANEOUS) ×3 IMPLANT
SYR CONTROL 10ML LL (SYRINGE) IMPLANT
TOWEL OR 17X24 6PK STRL BLUE (TOWEL DISPOSABLE) ×6 IMPLANT
TOWEL OR NON WOVEN STRL DISP B (DISPOSABLE) ×3 IMPLANT
UNDERPAD 30X30 (UNDERPADS AND DIAPERS) ×3 IMPLANT

## 2017-08-19 NOTE — Op Note (Signed)
08/19/2017 [facility]  Operative Note  Pre Op Diagnosis: Left extraarticular distal radius fracture  Post Op Diagnosis: Left extraarticular distal radius fracture  Procedure: ORIF Left extraarticular distal radius fracture  Surgeon: Leanora Cover, MD  Assistant: Daryll Brod, MD  Anesthesia: MAC and Regional  Fluids: Per anesthesia flow sheet  EBL: minimal  Complications: None  Specimen: None  Tourniquet Time:  Total Tourniquet Time Documented: Upper Arm (Left) - 36 minutes Total: Upper Arm (Left) - 36 minutes   Disposition: Stable to PACU  INDICATIONS:  Stacy [name] is a 57 y.o. female states she tripped over a curb injuring left wrist 2 weeks ago.  XR revealed distal radius fracture.  We discussed nonoperative and operative treatment options.  She wished to proceed with operative fixation.  Risks, benefits, and alternatives of surgery were discussed including the risk of blood loss; infection; damage to nerves, vessels, tendons, ligaments, bone; failure of surgery; need for additional surgery; complications with wound healing; continued pain; nonunion; malunion; stiffness.  We also discussed the possible need for bone graft and the benefits and risks including the possibility of disease transmission.  She voiced understanding of these risks and elected to proceed.   OPERATIVE COURSE:  After being identified preoperatively by myself, the patient and I agreed upon the procedure and site of procedure.  Surgical site was marked.  The risks, benefits and alternatives of the surgery were reviewed and she wished to proceed.  Surgical consent had been signed.  She was given IV Ancef as preoperative antibiotic prophylaxis.  She was transferred to the operating room and placed on the operating room table in supine position with the Left upper extremity on an armboard. MAC and Regional anesthesia was induced by the anesthesiologist.  The Left upper extremity was prepped  and draped in normal sterile orthopedic fashion.  A surgical pause was performed between the surgeons, anesthesia and operating room staff, and all were in agreement as to the patient, procedure and site of procedure.  Tourniquet at the proximal aspect of the extremity was inflated to 250 mmHg after exsanguination of the limb with an Esmarch bandage.  Standard volar Mallie Mussel approach was used.  The bipolar electrocautery was used to obtain hemostasis.  The superficial and deep portions of the FCR tendon sheath were incised, and the FCR and FPL were swept ulnarly to protect the palmar cutaneous branch of the median nerve.  The brachioradialis was released at the radial side of the radius.  The pronator quadratus was released and elevated with the periosteal elevator.  The fracture site was identified and cleared of soft tissue interposition and hematoma.  It was reduced under direct visualization.   An AcuMed volar distal radial locking plate was selected.  It was secured to the bone with the guidepins.  C-arm was used in AP and lateral projections to ensure appropriate reduction and position of the hardware and adjustments made as necessary.  Standard AO drilling and measuring technique was used.  A single screw was placed in the slotted hole in the shaft of the plate.  The distal holes were filled with locking pegs with the exception of the styloid holes, which were filled with locking screws.  The remaining holes in the shaft of the plate were filled with nonlocking screws.  Good purchase was obtained.  C-arm was used in AP, lateral and oblique projections to ensure appropriate reduction and position of hardware, which was the case.  There was no intra-articular penetration of  hardware.  The wound was copiously irrigated with sterile saline.  Pronator quadratus was repaired back over top of the plate using 4-0 Vicryl suture.  Vicryl suture was placed in the subcutaneous tissues in an inverted interrupted fashion and  the skin was closed with 4-0 nylon in a horizontal mattress fashion.  There was good pronation and supination of the wrist without crepitance.  The wound was then dressed with sterile Xeroform, 4x4s, and wrapped with a Kerlix bandage.  A volar splint was placed and wrapped with Kerlix and Ace bandage.  Tourniquet was deflated at 35 minutes.  Fingertips were pink with brisk capillary refill after deflation of the tourniquet.  Operative drapes were broken down.  The patient was awoken from anesthesia safely.  She was transferred back to the stretcher and taken to the PACU in stable condition.  I will see her back in the office in one week for postoperative followup.  I will give her a prescription for norco 5/325 1-2 tabs PO q6 hours prn pain, dispense #30.    Tennis Must, MD Electronically signed, 08/19/17

## 2017-08-19 NOTE — Brief Op Note (Signed)
08/19/2017  11:16 AM  PATIENT:  Bard Herbertosaleen M Gotham  57 y.o. female  PRE-OPERATIVE DIAGNOSIS:  LEFT DISTAL RADIUS FRACTURE  POST-OPERATIVE DIAGNOSIS:  LEFT DISTAL RADIUS FRACTURE  PROCEDURE:  Procedure(s): OPEN REDUCTION INTERNAL FIXATION (ORIF) LEFT DISTAL RADIAL FRACTURE (Left)  SURGEON:  Surgeon(s) and Role:    * Betha LoaKuzma, Yeraldine Forney, MD - Primary    * Cindee SaltKuzma, Gary, MD - Assisting  PHYSICIAN ASSISTANT:   ASSISTANTS: Cindee SaltGary Vernesha Talbot, MD   ANESTHESIA:   regional and sedation  EBL:  Minimal  BLOOD ADMINISTERED:none  DRAINS: none   LOCAL MEDICATIONS USED:  NONE  SPECIMEN:  No Specimen  DISPOSITION OF SPECIMEN:  N/A  COUNTS:  YES  TOURNIQUET:   Total Tourniquet Time Documented: Upper Arm (Left) - 36 minutes Total: Upper Arm (Left) - 36 minutes   DICTATION: .Note written in EPIC  PLAN OF CARE: Discharge to home after PACU  PATIENT DISPOSITION:  PACU - hemodynamically stable.

## 2017-08-19 NOTE — H&P (Signed)
Stacy [name] is an 57 y.o. female.   Chief Complaint: left wrist fracture HPI: 57 yo female states she injured left wrist when she tripped on curb 08/04/17.  Did not initially seek medical attention.  Radiographs show distal radius fracture.  She wishes to proceed with operative fixation of the fracture.  Allergies:  Allergies  Allergen Reactions  . Lunesta [Eszopiclone] Other (See Comments)    PSYCHOSIS  . Ambien [Zolpidem Tartrate] Other (See Comments)    hallucinations    Past Medical History:  Diagnosis Date  . Alcohol abuse   . Allergy   . Anxiety   . Closed fracture of left distal radius   . Depression   . HSV (herpes simplex virus) infection   . Hyperlipidemia   . Insomnia   . Unspecified hypothyroidism    Graves    Past Surgical History:  Procedure Laterality Date  . COLPOSCOPY    . COSMETIC SURGERY     breast implants saline    Family History: Family History  Problem Relation Age of Onset  . Stroke Mother   . Leukemia Father   . Alcohol abuse Father   . Depression Sister   . Alcohol abuse Sister   . Bipolar disorder Sister   . Depression Sister   . Depression Sister   . Alcohol abuse Sister   . Depression Sister   . Heart disease Other   . Hyperlipidemia Other   . Diabetes Other   . Alcohol abuse Maternal Uncle   . Alcohol abuse Paternal Uncle   . Alcohol abuse Paternal Grandfather     Social History:   reports that she quit smoking about 11 years ago. She smoked 1.00 pack per day. she has never used smokeless tobacco. She reports that she does not drink alcohol or use drugs.  Medications: Medications Prior to Admission  Medication Sig Dispense Refill  . ibuprofen (ADVIL,MOTRIN) 200 MG tablet Take 200 mg by mouth every 6 (six) hours as needed.    Marland Kitchen levothyroxine (SYNTHROID, LEVOTHROID) 88 MCG tablet Take 1 tablet (88 mcg total) by mouth daily. 90 tablet 0  . Suvorexant (BELSOMRA) 20 MG TABS Take by mouth.    . traMADol (ULTRAM) 50 MG  tablet Take 1 tablet (50 mg total) by mouth every 6 (six) hours as needed. 15 tablet 0  . acyclovir (ZOVIRAX) 200 MG capsule Take 1 capsule (200 mg total) by mouth 2 (two) times daily. 60 capsule 3  . SUMAtriptan (IMITREX) 100 MG tablet Take 1 tablet (100 mg total) by mouth once. May repeat in 2 hours if headache persists or recurs. 10 tablet 0    No results found for this or any previous visit (from the past 48 hour(s)).  No results found.   A comprehensive review of systems was negative.  Blood pressure 112/76, pulse (!) 59, temperature 97.8 F (36.6 C), temperature source Oral, resp. rate 12, height 5\' 2"  (1.575 m), weight 56.7 kg (125 lb), SpO2 100 %.  General appearance: alert, cooperative and appears stated age Head: Normocephalic, without obvious abnormality, atraumatic Neck: supple, symmetrical, trachea midline Cardio: regular rate and rhythm Resp: clear to auscultation bilaterally Extremities: Intact sensation and capillary refill all digits.  +epl/fpl/io.  No wounds.  Pulses: 2+ and symmetric Skin: Skin color, texture, turgor normal. No rashes or lesions Neurologic: Grossly normal Incision/Wound:none  Assessment/Plan Left distal radius fracture.  Non operative and operative treatment options were discussed with the patient and patient wishes to proceed with operative treatment.  Risks, benefits, and alternatives of surgery were discussed and the patient agrees with the plan of care.   Reeva Davern R 08/19/2017, 10:05 AM

## 2017-08-19 NOTE — Discharge Instructions (Addendum)
° °  ° ° ° °[facility] Instructions °Hand Surgery ° °Wound Care: °Keep your hand elevated above the level of your heart.  Do not allow it to dangle by your side.  Keep the dressing dry and do not remove it unless your doctor advises you to do so.  He will usually change it at the time of your post-op visit.  Moving your fingers is advised to stimulate circulation but will depend on the site of your surgery.  If you have a splint applied, your doctor will advise you regarding movement. ° °Activity: °Do not drive or operate machinery today.  Rest today and then you may return to your normal activity and work as indicated by your physician. ° °Diet:  °Drink liquids today or eat a light diet.  You may resume a regular diet tomorrow.   ° °General expectations: °Pain for two to three days. °Fingers may become slightly swollen. ° °Call your doctor if any of the following occur: °Severe pain not relieved by pain medication. °Elevated temperature. °Dressing soaked with blood. °Inability to move fingers. °White or bluish color to fingers. ° ° °Regional Anesthesia Blocks ° °1. Numbness or the inability to move the "blocked" extremity may last from 3-48 hours after placement. The length of time depends on the medication injected and your individual response to the medication. If the numbness is not going away after 48 hours, call your surgeon. ° °2. The extremity that is blocked will need to be protected until the numbness is gone and the  Strength has returned. Because you cannot feel it, you will need to take extra care to avoid injury. Because it may be weak, you may have difficulty moving it or using it. You may not know what position it is in without looking at it while the block is in effect. ° °3. For blocks in the legs and feet, returning to weight bearing and walking needs to be done carefully. You will need to wait until the numbness is entirely gone and the strength has returned. You should be able to move your leg  and foot normally before you try and bear weight or walk. You will need someone to be with you when you first try to ensure you do not fall and possibly risk injury. ° °4. Bruising and tenderness at the needle site are common side effects and will resolve in a few days. ° °5. Persistent numbness or new problems with movement should be communicated to the surgeon or the [facility] ([phone])/ [facility] ([phone]). ° ° ° °Post Anesthesia Home Care Instructions ° °Activity: °Get plenty of rest for the remainder of the day. A responsible individual must stay with you for 24 hours following the procedure.  °For the next 24 hours, DO NOT: °-Drive a car °-Operate machinery °-Drink alcoholic beverages °-Take any medication unless instructed by your physician °-Make any legal decisions or sign important papers. ° °Meals: °Start with liquid foods such as gelatin or soup. Progress to regular foods as tolerated. Avoid greasy, spicy, heavy foods. If nausea and/or vomiting occur, drink only clear liquids until the nausea and/or vomiting subsides. Call your physician if vomiting continues. ° °Special Instructions/Symptoms: °Your throat may feel dry or sore from the anesthesia or the breathing tube placed in your throat during surgery. If this causes discomfort, gargle with warm salt water. The discomfort should disappear within 24 hours. ° °If you had a scopolamine patch placed behind your ear for   the management of post- operative nausea and/or vomiting: ° °1. The medication in the patch is effective for 72 hours, after which it should be removed.  Wrap patch in a tissue and discard in the trash. Wash hands thoroughly with soap and water. °2. You may remove the patch earlier than 72 hours if you experience unpleasant side effects which may include dry mouth, dizziness or visual disturbances. °3. Avoid touching the patch. Wash your hands with soap and water after contact with the patch. °  ° ° °

## 2017-08-19 NOTE — Op Note (Signed)
I assisted Surgeon(s) and Role:    * Betha LoaKuzma, Kevin, MD - Primary    Cindee Salt* Keoni Havey, MD - Assisting on the Procedure(s): OPEN REDUCTION INTERNAL FIXATION (ORIF) LEFT DISTAL RADIAL FRACTURE on 08/19/2017.  I provided assistance on this case as follows: setup approach, identification, debridement manipulation, reduction,stabilization and fixation of the fracture,closure and application of the dressings and splint.  Electronically signed by: Nicki ReaperKUZMA,Zeek Rostron R, MD Date: 08/19/2017 Time: 11:20 AM

## 2017-08-19 NOTE — Progress Notes (Signed)
Assisted Dr. Turk with left, ultrasound guided, supraclavicular block. Side rails up, monitors on throughout procedure. See vital signs in flow sheet. Tolerated Procedure well. 

## 2017-08-19 NOTE — Anesthesia Procedure Notes (Signed)
Procedure Name: MAC Date/Time: 08/19/2017 10:34 AM Performed by: Signe Colt, CRNA Pre-anesthesia Checklist: Patient identified, Emergency Drugs available, Suction available, Patient being monitored and Timeout performed Patient Re-evaluated:Patient Re-evaluated prior to induction Oxygen Delivery Method: Simple face mask

## 2017-08-19 NOTE — Anesthesia Postprocedure Evaluation (Signed)
Anesthesia Post Note  Patient: Stacy [name]  Procedure(s) Performed: OPEN REDUCTION INTERNAL FIXATION (ORIF) LEFT DISTAL RADIAL FRACTURE (Left Wrist)     Patient location during evaluation: PACU Anesthesia Type: Regional and MAC Level of consciousness: awake and alert Pain management: pain level controlled Vital Signs Assessment: post-procedure vital signs reviewed and stable Respiratory status: spontaneous breathing, nonlabored ventilation, respiratory function stable and patient connected to nasal cannula oxygen Cardiovascular status: stable and blood pressure returned to baseline Postop Assessment: no apparent nausea or vomiting Anesthetic complications: no    Last Vitals:  Vitals:   08/19/17 1200 08/19/17 1219  BP: (!) 143/91 138/88  Pulse: 61 65  Resp: 13 16  Temp:  36.6 C  SpO2: 98% 99%    Last Pain:  Vitals:   08/19/17 1219  TempSrc:   PainSc: 0-No pain                 Catalina Gravel

## 2017-08-19 NOTE — Anesthesia Procedure Notes (Signed)
Anesthesia Regional Block: Supraclavicular block   Pre-Anesthetic Checklist: ,, timeout performed, Correct Patient, Correct Site, Correct Laterality, Correct Procedure, Correct Position, site marked, Risks and benefits discussed,  Surgical consent,  Pre-op evaluation,  At surgeon's request and post-op pain management  Laterality: Left  Prep: chloraprep       Needles:  Injection technique: Single-shot  Needle Type: Echogenic Needle     Needle Length: 9cm  Needle Gauge: 21     Additional Needles:   Procedures:,,,, ultrasound used (permanent image in chart),,,,  Narrative:  Start time: 08/19/2017 9:45 AM End time: 08/19/2017 9:50 AM Injection made incrementally with aspirations every 5 mL.  Performed by: Personally  Anesthesiologist: Cecile Hearingurk, Zachrey Deutscher Edward, MD  Additional Notes: No pain on injection. No increased resistance to injection. Injection made in 5cc increments.  Good needle visualization.  Patient tolerated procedure well.

## 2017-08-19 NOTE — Anesthesia Preprocedure Evaluation (Addendum)
Anesthesia Evaluation  Patient identified by MRN, date of birth, ID band Patient awake    Reviewed: Allergy & Precautions, NPO status , Patient's Chart, lab work & pertinent test results  Airway Mallampati: II  TM Distance: >3 FB Neck ROM: Full    Dental  (+) Teeth Intact, Dental Advisory Given, Loose,    Pulmonary former smoker,    Pulmonary exam normal breath sounds clear to auscultation       Cardiovascular negative cardio ROS Normal cardiovascular exam Rhythm:Regular Rate:Normal     Neuro/Psych PSYCHIATRIC DISORDERS Anxiety Depression negative neurological ROS     GI/Hepatic negative GI ROS, (+)     substance abuse  alcohol use,   Endo/Other  Hypothyroidism   Renal/GU negative Renal ROS     Musculoskeletal Left distal radius fracture    Abdominal   Peds  Hematology negative hematology ROS (+)   Anesthesia Other Findings Day of surgery medications reviewed with the patient.  Reproductive/Obstetrics                           Anesthesia Physical Anesthesia Plan  ASA: II  Anesthesia Plan: Regional and MAC   Post-op Pain Management:    Induction: Intravenous  PONV Risk Score and Plan: 2 and Propofol infusion, Ondansetron and Midazolam  Airway Management Planned: Simple Face Mask  Additional Equipment:   Intra-op Plan:   Post-operative Plan:   Informed Consent: I have reviewed the patients History and Physical, chart, labs and discussed the procedure including the risks, benefits and alternatives for the proposed anesthesia with the patient or authorized representative who has indicated his/her understanding and acceptance.   Dental advisory given  Plan Discussed with:   Anesthesia Plan Comments: (Block plus MAC)        Anesthesia Quick Evaluation

## 2017-08-19 NOTE — Transfer of Care (Signed)
Immediate Anesthesia Transfer of Care Note  Patient: Stacy [name]  Procedure(s) Performed: OPEN REDUCTION INTERNAL FIXATION (ORIF) LEFT DISTAL RADIAL FRACTURE (Left Wrist)  Patient Location: PACU  Anesthesia Type:MAC combined with regional for post-op pain  Level of Consciousness: awake, alert , oriented and patient cooperative  Airway & Oxygen Therapy: Patient Spontanous Breathing and Patient connected to face mask oxygen  Post-op Assessment: Report given to RN and Post -op Vital signs reviewed and stable  Post vital signs: Reviewed and stable  Last Vitals:  Vitals:   08/19/17 0952 08/19/17 0953  BP:    Pulse: (!) 59 (!) 59  Resp: 13 12  Temp:    SpO2: 100% 100%    Last Pain:  Vitals:   08/19/17 0919  TempSrc: Oral  PainSc: 7       Patients Stated Pain Goal: 4 (82/51/89 [id number])  Complications: No apparent anesthesia complications

## 2017-08-23 ENCOUNTER — Encounter (HOSPITAL_BASED_OUTPATIENT_CLINIC_OR_DEPARTMENT_OTHER): Payer: Self-pay | Admitting: Orthopedic Surgery

## 2017-09-07 ENCOUNTER — Other Ambulatory Visit: Payer: Self-pay

## 2017-09-07 DIAGNOSIS — I83893 Varicose veins of bilateral lower extremities with other complications: Secondary | ICD-10-CM

## 2017-09-17 ENCOUNTER — Inpatient Hospital Stay (HOSPITAL_COMMUNITY): Admission: RE | Admit: 2017-09-17 | Payer: Self-pay | Source: Ambulatory Visit

## 2017-09-17 ENCOUNTER — Encounter: Payer: BLUE CROSS/BLUE SHIELD | Admitting: Vascular Surgery

## 2019-01-18 ENCOUNTER — Emergency Department (HOSPITAL_COMMUNITY)
Admission: EM | Admit: 2019-01-18 | Discharge: 2019-01-18 | Disposition: A | Discharge status: Home / Self Care | Payer: BC Managed Care – PPO | Attending: Emergency Medicine | Admitting: Emergency Medicine

## 2019-01-18 ENCOUNTER — Emergency Department (HOSPITAL_COMMUNITY): Payer: BC Managed Care – PPO

## 2019-01-18 ENCOUNTER — Other Ambulatory Visit: Payer: Self-pay

## 2019-01-18 DIAGNOSIS — R002 Palpitations: Secondary | ICD-10-CM | POA: Diagnosis not present

## 2019-01-18 DIAGNOSIS — E0500 Thyrotoxicosis with diffuse goiter without thyrotoxic crisis or storm: Secondary | ICD-10-CM | POA: Diagnosis not present

## 2019-01-18 DIAGNOSIS — Z87891 Personal history of nicotine dependence: Secondary | ICD-10-CM | POA: Diagnosis not present

## 2019-01-18 DIAGNOSIS — M25512 Pain in left shoulder: Secondary | ICD-10-CM | POA: Insufficient documentation

## 2019-01-18 DIAGNOSIS — Z79899 Other long term (current) drug therapy: Secondary | ICD-10-CM | POA: Diagnosis not present

## 2019-01-18 LAB — BASIC METABOLIC PANEL
Anion gap: 16 — ABNORMAL HIGH (ref 5–15)
BUN: 8 mg/dL (ref 6–20)
CO2: 19 mmol/L — ABNORMAL LOW (ref 22–32)
Calcium: 9.8 mg/dL (ref 8.9–10.3)
Chloride: 100 mmol/L (ref 98–111)
Creatinine, Ser: 0.83 mg/dL (ref 0.44–1.00)
GFR calc Af Amer: >60 mL/min (ref >60)
GFR calc non Af Amer: >60 mL/min (ref >60)
Glucose, Bld: 111 mg/dL — ABNORMAL HIGH (ref 70–99)
Potassium: 4.2 mmol/L (ref 3.5–5.1)
Sodium: 135 mmol/L (ref 135–145)

## 2019-01-18 LAB — CBC
HCT: 37.9 % (ref 36.0–46.0)
Hemoglobin: 13.1 g/dL (ref 12.0–15.0)
MCH: 37.1 pg — ABNORMAL HIGH (ref 26.0–34.0)
MCHC: 34.6 g/dL (ref 30.0–36.0)
MCV: 107.4 fL — ABNORMAL HIGH (ref 80.0–100.0)
Platelets: 243 10*3/uL (ref 150–400)
RBC: 3.53 MIL/uL — ABNORMAL LOW (ref 3.87–5.11)
RDW: 14 % (ref 11.5–15.5)
WBC: 8.3 10*3/uL (ref 4.0–10.5)
nRBC: 0 % (ref 0.0–0.2)

## 2019-01-18 LAB — I-STAT BETA HCG BLOOD, ED (MC, WL, AP ONLY): I-stat hCG, quantitative: <5 m[IU]/mL (ref <5)

## 2019-01-18 LAB — TROPONIN I (HIGH SENSITIVITY)
Troponin I (High Sensitivity): 5 ng/L (ref <18)
Troponin I (High Sensitivity): 5 ng/L (ref <18)

## 2019-01-18 LAB — TSH: TSH: 0.339 u[IU]/mL — ABNORMAL LOW (ref 0.350–4.500)

## 2019-01-18 MED ORDER — IOHEXOL 350 MG/ML SOLN
100.0000 mL | Freq: Once | INTRAVENOUS | Status: AC | PRN
  Administered 2019-01-18: 100 mL via INTRAVENOUS

## 2019-01-18 MED ORDER — HYDROCODONE-ACETAMINOPHEN 5-325 MG PO TABS: 1.0000 | ORAL_TABLET | Freq: Four times a day (QID) | ORAL | 0 refills | Status: DC | PRN

## 2019-01-18 MED ORDER — SODIUM CHLORIDE 0.9% FLUSH
3.0000 mL | Freq: Once | INTRAVENOUS | Status: AC
  Administered 2019-01-18: 18:00:00 3 mL via INTRAVENOUS

## 2019-01-18 MED ORDER — KETOROLAC TROMETHAMINE 30 MG/ML IJ SOLN
15.0000 mg | Freq: Once | INTRAMUSCULAR | Status: AC
  Administered 2019-01-18: 19:00:00 15 mg via INTRAVENOUS
  Filled 2019-01-18: qty 1

## 2019-01-18 MED ORDER — CYCLOBENZAPRINE HCL 10 MG PO TABS: 10.0000 mg | ORAL_TABLET | Freq: Two times a day (BID) | ORAL | 0 refills | Status: DC | PRN

## 2019-01-18 MED ORDER — CYCLOBENZAPRINE HCL 10 MG PO TABS: 10.0000 mg | ORAL_TABLET | Freq: Two times a day (BID) | ORAL | 0 refills | Status: AC | PRN

## 2019-01-18 MED ORDER — HYDROCODONE-ACETAMINOPHEN 5-325 MG PO TABS: 1.0000 | ORAL_TABLET | Freq: Four times a day (QID) | ORAL | 0 refills | Status: AC | PRN

## 2019-01-18 MED ORDER — CYCLOBENZAPRINE HCL 10 MG PO TABS
10.0000 mg | ORAL_TABLET | Freq: Once | ORAL | Status: AC
  Administered 2019-01-18: 20:00:00 10 mg via ORAL
  Filled 2019-01-18: qty 1

## 2019-01-18 NOTE — ED Notes (Signed)
Pt c/o back pain (10/10) and asks for some pain meds. 

## 2019-01-18 NOTE — ED Provider Notes (Signed)
[facility] Provider Note   CSN: [id number] Arrival date & time: 01/18/19  1228    History   Chief Complaint Chief Complaint  Patient presents with  . Shoulder Pain  . Arm Pain    HPI Stacy [name] is a 58 y.o. female with history of Graves' disease status post radioactive iodine ablation, anxiety, depression, and HLD presents to the ED complaining of posterior left shoulder pain for the past week.  Patient reports this pain has been constant and feels like a stabbing sensation.  She reports some radiation of this pain with tingling that radiates into her upper arm.  Patient reports has been having palpitations over the past few months for which she wore an outpatient heart monitor.  She reports she recently got the results from this monitor and was told that her heart rate has been as high as the 240s and that she needed to come to the emergency department for evaluation.  Patient recently had her levothyroxine dose adjusted by her endocrinologist.  Patient reports she was just recently put on metoprolol which she has not yet started.  She denies any recent fever, chills, cough, chest pain, shortness of breath, abdominal pain, nausea, vomiting, diarrhea, or any other complaints.     The history is provided by the patient.    Past Medical History:  Diagnosis Date  . Alcohol abuse   . Allergy   . Anxiety   . Closed fracture of left distal radius   . Depression   . HSV (herpes simplex virus) infection   . Hyperlipidemia   . Insomnia   . Unspecified hypothyroidism    Graves    Patient Active Problem List   Diagnosis Date Noted  . Adjustment disorder with anxious mood 10/10/2015  . Dysfunctional family due to alcoholism 07/22/2015  . Alcohol use disorder, severe, dependence (HCC) 07/19/2015  . Acute confusion 07/09/2015  . Alcohol abuse 04/22/2015  . Abnormal glucose 01/15/2015  . Vitamin D deficiency 01/15/2015  . Noncompliance  10/29/2014  . Hypothyroidism 06/27/2014  . Encounter for long-term (current) use of medications 06/27/2014  . 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 Noncompliance with medication treatment due to overuse of medication 09/06/2013  . Hypertension   . Anxiety   . Depression   . Insomnia   . Hyperlipidemia     Past Surgical History:  Procedure Laterality Date  . COLPOSCOPY    . COSMETIC SURGERY     breast implants saline  . OPEN REDUCTION INTERNAL FIXATION (ORIF) DISTAL RADIAL FRACTURE Left 08/19/2017   Procedure: OPEN REDUCTION INTERNAL FIXATION (ORIF) LEFT DISTAL RADIAL FRACTURE;  Surgeon: Betha LoaKuzma, Kevin, MD;  Location: [facility];  Service: Orthopedics;  Laterality: Left;     OB History   No obstetric history on file.      Home Medications    Prior to Admission medications   Medication Sig Start Date End Date Taking? Authorizing Provider  ibuprofen (ADVIL,MOTRIN) 200 MG tablet Take 200 mg by mouth every 6 (six) hours as needed.   Yes [provider]  levothyroxine (SYNTHROID) 112 MCG tablet Take 112 mcg by mouth daily before breakfast.   Yes [provider]  Suvorexant (BELSOMRA) 20 MG TABS Take 20 mg by mouth at bedtime.    Yes [provider]  cyclobenzaprine (FLEXERIL) 10 MG tablet Take 1 tablet (10 mg total) by mouth 2 (two) times daily as needed for up to 5 days for muscle spasms. 01/18/19 01/23/19  Charlesetta Shanks, MD  HYDROcodone-acetaminophen (NORCO/VICODIN) 5-325 MG tablet Take 1-2 tablets by mouth every 6 (six) hours as needed for moderate pain or severe pain. 01/18/19   Charlesetta Shanks, MD  metoprolol succinate (TOPROL-XL) 25 MG 24 hr tablet Take 12.5 mg by mouth at bedtime.    [provider]    Family History Family History  Problem Relation Age of Onset  . Stroke Mother   . Leukemia Father   . Alcohol abuse Father   . Depression Sister   . Alcohol abuse Sister   . Bipolar disorder Sister   . Depression  Sister   . Depression Sister   . Alcohol abuse Sister   . Depression Sister   . Heart disease Other   . Hyperlipidemia Other   . Diabetes Other   . Alcohol abuse Maternal Uncle   . Alcohol abuse Paternal Uncle   . Alcohol abuse Paternal Grandfather     Social History Social History   Tobacco Use  . Smoking status: Former Smoker    Packs/day: 1.00    Quit date: 06/08/2006    Years since quitting: 12.6  . Smokeless tobacco: Never Used  Substance Use Topics  . Alcohol use: No    Alcohol/week: 90.0 standard drinks    Types: 90 Glasses of wine per week    Frequency: Never    Comment: Per previous documentation: 90 glasses of wine per week; pt now states only occasional alcohol use 11/02/15  . Drug use: No     Allergies   Lunesta [eszopiclone] and Ambien [zolpidem tartrate]   Review of Systems Review of Systems  Constitutional: Negative for chills and fever.  HENT: Negative for ear pain and sore throat.   Eyes: Negative for pain and visual disturbance.  Respiratory: Negative for cough and shortness of breath.   Cardiovascular: Positive for palpitations. Negative for chest pain.  Gastrointestinal: Negative for abdominal pain and vomiting.  Genitourinary: Negative for dysuria and hematuria.  Musculoskeletal: Positive for arthralgias. Negative for back pain.  Skin: Negative for color change and rash.  Neurological: Negative for seizures and syncope.  All other systems reviewed and are negative.    Physical Exam Updated Vital Signs BP (!) 167/115 (BP Location: Right Arm)   Pulse 89   Temp 98.4 F (36.9 C) (Oral)   Resp 19   SpO2 100%   Physical Exam Vitals signs and nursing note reviewed.  Constitutional:      General: She is not in acute distress.    Appearance: Normal appearance. She is normal weight. She is not ill-appearing, toxic-appearing or diaphoretic.  HENT:     Head: Normocephalic and atraumatic.     Nose: Nose normal. No congestion or rhinorrhea.      Mouth/Throat:     Mouth: Mucous membranes are moist.     Pharynx: Oropharynx is clear. No oropharyngeal exudate or posterior oropharyngeal erythema.  Eyes:     Extraocular Movements: Extraocular movements intact.     Conjunctiva/sclera: Conjunctivae normal.     Pupils: Pupils are equal, round, and reactive to light.  Neck:     Musculoskeletal: Normal range of motion and neck supple. No neck rigidity or muscular tenderness.     Comments: Goiter present.  No palpable nodules or masses. Cardiovascular:     Rate and Rhythm: Regular rhythm. Tachycardia present.     Pulses: Normal pulses.     Heart sounds: Normal heart sounds. No murmur. No friction rub. No gallop.   Pulmonary:  Effort: Pulmonary effort is normal. No respiratory distress.     Breath sounds: Normal breath sounds. No stridor. No wheezing, rhonchi or rales.  Chest:     Chest wall: No tenderness.  Abdominal:     General: Abdomen is flat. There is no distension.     Palpations: Abdomen is soft.     Tenderness: There is no abdominal tenderness. There is no guarding or rebound.  Musculoskeletal: Normal range of motion.        General: Tenderness (to paraspinal musculature between left scapula and thoracic spine) present. No swelling, deformity or signs of injury.  Skin:    General: Skin is warm and dry.  Neurological:     General: No focal deficit present.     Mental Status: She is alert and oriented to person, place, and time. Mental status is at baseline.     Cranial Nerves: No cranial nerve deficit.     Sensory: No sensory deficit.     Motor: No weakness.  Psychiatric:        Mood and Affect: Mood normal.        Behavior: Behavior normal.      ED Treatments / Results  Labs (all labs ordered are listed, but only abnormal results are displayed) Labs Reviewed  BASIC METABOLIC PANEL - Abnormal; Notable for the following components:      Result Value   CO2 19 (*)    Glucose, Bld 111 (*)    Anion gap 16 (*)    All  other components within normal limits  CBC - Abnormal; Notable for the following components:   RBC 3.53 (*)    MCV 107.4 (*)    MCH 37.1 (*)    All other components within normal limits  TSH - Abnormal; Notable for the following components:   TSH 0.339 (*)    All other components within normal limits  T3, FREE  I-STAT BETA HCG BLOOD, ED ([facility], [facility], [facility] ONLY)  TROPONIN I (HIGH SENSITIVITY)  TROPONIN I (HIGH SENSITIVITY)    EKG EKG Interpretation  Date/Time:  Wednesday January 18 2019 17:11:55 EDT Ventricular Rate:  93 PR Interval:    QRS Duration: 91 QT Interval:  365 QTC Calculation: 454 R Axis:   43 Text Interpretation:  Sinus rhythm Biatrial enlargement RSR' in V1 or V2, right VCD or RVH Borderline T abnormalities, anterior leads no acute ischemic appearance, no old comparison Confirmed by Arby BarrettePfeiffer, Marcy [id number]) on 01/18/2019 5:32:02 PM   Radiology Dg Chest 2 View  Result Date: 01/18/2019 CLINICAL DATA:  Left arm pain since this morning. EXAM: CHEST - 2 VIEW COMPARISON:  July 14, 2012 FINDINGS: The heart size and mediastinal contours are within normal limits. Both lungs are clear. The visualized skeletal structures are unremarkable. IMPRESSION: No active cardiopulmonary disease. Electronically Signed   By: Sherian ReinWei-Chen  Lin M.D.   On: 01/18/2019 13:07   Ct Angio Chest/abd/pel For Dissection W And/or Wo Contrast  Result Date: 01/18/2019 CLINICAL DATA:  Chest, back pain, acute, suspect aortic dissection EXAM: CT ANGIOGRAPHY CHEST, ABDOMEN AND PELVIS TECHNIQUE: Multidetector CT imaging through the chest, abdomen and pelvis was performed using the standard protocol during bolus administration of intravenous contrast. Multiplanar reconstructed images and MIPs were obtained and reviewed to evaluate the vascular anatomy. CONTRAST:  100mL OMNIPAQUE IOHEXOL 350 MG/ML SOLN COMPARISON:  Chest radiograph January 18, 2019 FINDINGS: CTA CHEST FINDINGS Cardiovascular: Contrast bolus is satisfactory  for preferential opacification of thoracic aorta. The aortic root is suboptimally assessed given  cardiac pulsation artifact. The aorta is normal caliber. No intramural hematoma, dissection flap or other luminal abnormality of the aorta is seen. No periaortic stranding or hemorrhage. Incidental note made of the left vertebral artery arising directly from the aortic arch. Mild compressive affect the pectus deformity of the chest upon the right atrium and ventricle. Normal heart size. Trace pericardial fluid likely within physiologic normal. No frank pericardial effusion. Limited evaluation of the central pulmonary arteries reveals no a filling defect. Mediastinum/Nodes: No enlarged mediastinal or axillary lymph nodes. Thyroid gland, trachea, and esophagus demonstrate no significant findings. Lungs/Pleura: No consolidation, features of edema, pneumothorax, or effusion. No suspicious pulmonary nodules or masses. Musculoskeletal: Pectus excavatum deformity of the chest with increased Haller index of 3.7 with associated mild mass effect on the right atrium and ventricle. Bilateral subpectoral breast prostheses. No CT evidence of capsular rupture. Review of the MIP images confirms the above findings. CTA ABDOMEN AND PELVIS FINDINGS VASCULAR Aorta: Minimal calcified atheromatous plaque within the abdominal aorta. Normal caliber without aneurysm, dissection, vasculitis or stenosis. No periaortic stranding. Celiac: Patent without evidence of aneurysm, dissection, vasculitis or significant stenosis. SMA: Patent without evidence of aneurysm, dissection, vasculitis or significant stenosis. Renals: Both renal arteries are patent without evidence of aneurysm, dissection, vasculitis, fibromuscular dysplasia or significant stenosis. IMA: Patent without evidence of aneurysm, dissection, vasculitis or significant stenosis. Inflow: Minimal atheromatous disease within the internal iliac arteries bilaterally. Remainder of the inflow  vessels are unremarkable without aneurysm, dissection, vasculitis or stenosis Veins: Major venous structures are unremarkable. Review of the MIP images confirms the above findings. NON-VASCULAR Hepatobiliary: Diffuse hepatic hypoattenuation compatible with hepatic steatosis. Focal sparing versus THAD in the anterior right liver. No concerning focal liver abnormality is seen. No gallstones, gallbladder wall thickening, or biliary dilatation. Pancreas: Unremarkable. No pancreatic ductal dilatation or surrounding inflammatory changes. Spleen: Normal in size without focal abnormality. Adrenals/Urinary Tract: Adrenal glands are unremarkable. Kidneys are normal, without renal calculi, focal lesion, or hydronephrosis. Bladder is unremarkable. Stomach/Bowel: Distal esophagus, stomach and duodenal sweep are unremarkable. No bowel wall thickening or dilatation. No evidence of obstruction. Scattered colonic diverticula without focal pericolonic inflammation to suggest diverticulitis. A normal appendix is visualized. Lymphatic: No suspicious or enlarged lymph nodes in the included lymphatic chains. Reproductive: Normal anteverted uterus. No adnexal lesions. Other: No abdominal wall hernia or abnormality. No abdominopelvic ascites. Musculoskeletal: No acute or significant osseous findings. Review of the MIP images confirms the above findings. IMPRESSION: 1. No evidence of acute aortic syndrome. No acute intrathoracic or intra-abdominal process. 2. Pectus excavatum deformity of the chest with associated mild mass effect on the right atrium and ventricle. 3. Hepatic steatosis. 4. Colonic diverticulosis without evidence of diverticulitis 5.  Aortic Atherosclerosis (ICD10-I70.0). Electronically Signed   By: Kreg ShropshirePrice  DeHay M.D.   On: 01/18/2019 19:05    Procedures Procedures (including critical care time)  Medications Ordered in ED Medications  sodium chloride flush (NS) 0.9 % injection 3 mL (3 mLs Intravenous Given 01/18/19  1735)  iohexol (OMNIPAQUE) 350 MG/ML injection 100 mL (100 mLs Intravenous Contrast Given 01/18/19 1833)  ketorolac (TORADOL) 30 MG/ML injection 15 mg (15 mg Intravenous Given 01/18/19 1856)  cyclobenzaprine (FLEXERIL) tablet 10 mg (10 mg Oral Given 01/18/19 2001)     Initial Impression / Assessment and Plan / ED Course  I have reviewed the triage vital signs and the nursing notes.  Pertinent labs & imaging results that were available during my care of the patient were reviewed by me and considered  in my medical decision making (see chart for details).        Stacy [name] is a 58 y.o. female with history of Graves' disease status post radioactive iodine ablation, anxiety, depression, and HLD presents to the ED complaining of posterior left shoulder pain for the past week as well as palpitations over the past couple of months.  Patient recently had her levothyroxine dose adjusted by her endocrinologist.  On arrival to the ED, patient is tachycardic to the 120s and hypertensive to the 180s systolic.  Patient denies any known history of hypertension.  Patient's hypertension and tachycardia improved without intervention while in the ED.  BMP and CBC are unremarkable.  High-sensitivity troponin negative x2.  TSH slightly below the lower limit of normal at 0.339.  Chest x-ray shows no acute cardiopulmonary process.  EKG shows sinus rhythm without acute ischemic changes.  CTA of the chest/abdomen/pelvis was obtained to evaluate for aortic dissection given patient's hypertension, tachycardia, and back pain.  It showed no aortic disease or acute intrathoracic or intra-abdominal process.  Patient was given a dose of Toradol and Flexeril in the ED for her pain.  She was discharged home with prescriptions for Flexeril and hydrocodone.  She was advised to follow-up closely with her primary care physician and endocrinologist.  Patient was discharged home in stable condition.   Final Clinical  Impressions(s) / ED Diagnoses   Final diagnoses:  Acute pain of left shoulder    ED Discharge Orders         Ordered    HYDROcodone-acetaminophen (NORCO/VICODIN) 5-325 MG tablet  Every 6 hours PRN,   Status:  Discontinued     01/18/19 2008    cyclobenzaprine (FLEXERIL) 10 MG tablet  2 times daily PRN,   Status:  Discontinued     01/18/19 2023    HYDROcodone-acetaminophen (NORCO/VICODIN) 5-325 MG tablet  Every 6 hours PRN     01/18/19 2100    cyclobenzaprine (FLEXERIL) 10 MG tablet  2 times daily PRN     01/18/19 2100           Garry Heater, MD 01/19/19 Marilu Favre, MD 01/24/19 1318

## 2019-01-18 NOTE — ED Triage Notes (Signed)
Patient reports posterior L shoulder pain that has progressed over the last week going down her arm now. She denies injury. Also endorses palpitations but is unsure whether it is associated with pain because hx of thyroid disease. Told to come here for further evaluation by her PCP.

## 2019-01-18 NOTE — ED Provider Notes (Signed)
I saw and evaluated the patient, reviewed the resident's note and I agree with the findings and plan.  EKG: EKG sinus rhythm with no acute ischemic appearances.  Did not import from [place].  No old comparison.  Patient reports she has developed some pain behind her left shoulder blade for about the past week.  Is been constant.  Some radiation into the left arm that has a quality of some tingling sensation.  No dysfunction of the arm or the hand.  No significant change with position change.  Patient reports she has been having heart racing and palpitations.  She did wear a monitor and reports that her physician office called her citing that her heart rate had been up to the 240s and she needed to come to the emergency department for evaluation.  She reports for a number of months she has been aware of palpitations.  He does take levothyroxine regularly.  She reports she is had Graves' disease since she was in her 8s.  Patient is hypertensive.  She has not been on antihypertensive medications.  She does not seem to know at what point her blood pressures may have become elevated.  She reports she was just given a dose for metoprolol that she filled and has not started yet.  Patient is alert and appropriate.  Mental status is clear.  Face has a slightly puffy appearance.  Minimal appearance of proptosis.  Weight is patent.  Neck has a moderately large goiter.  Heart is regular without rub murmur gallop rate is in the 90s.  Radial pulses are 2+ and symmetric.  Lungs are clear without wheeze rhonchi rales.  Abdomen is soft and nontender.  Patient does have a easily palpable aortic pulsation in the epigastrium which is nontender.  Patient does have a local focus of reproducible tenderness to palpation in the subscapularis muscle of the left posterior thorax.  No peripheral edema calves are soft and nontender.  Feet and lower extremities are in good condition.  Patient presents with symptoms that include rotations  with tachycardia that reportedly is been monitored at a rate up to 240.  I do not have that tracing at this time.  Here in the emergency department, patient has a sinus rhythm that is narrow complex without ischemic changes and easily palpable pulses in the 90s.  We will proceed with CT angios of the chest to rule out dissection and aneurysm.  Patient is due to start metoprolol.  I agree we will have Toprol initiated.    Charlesetta Shanks, MD 01/18/19 1737

## 2019-01-19 LAB — T3, FREE: T3, Free: 4.2 pg/mL (ref 2.0–4.4)

## 2019-01-27 ENCOUNTER — Encounter: Payer: Self-pay | Admitting: Cardiology

## 2019-01-27 ENCOUNTER — Ambulatory Visit (INDEPENDENT_AMBULATORY_CARE_PROVIDER_SITE_OTHER): Payer: BC Managed Care – PPO | Admitting: Cardiology

## 2019-01-27 ENCOUNTER — Ambulatory Visit: Payer: Self-pay | Admitting: Cardiology

## 2019-01-27 ENCOUNTER — Other Ambulatory Visit: Payer: Self-pay

## 2019-01-27 VITALS — BP 137/99 | HR 149 | Temp 97.8°F | Ht 62.0 in | Wt 128.0 lb

## 2019-01-27 DIAGNOSIS — I10 Essential (primary) hypertension: Secondary | ICD-10-CM

## 2019-01-27 DIAGNOSIS — R002 Palpitations: Secondary | ICD-10-CM | POA: Diagnosis not present

## 2019-01-27 DIAGNOSIS — I471 Supraventricular tachycardia: Secondary | ICD-10-CM | POA: Diagnosis not present

## 2019-01-27 MED ORDER — METOPROLOL SUCCINATE ER 50 MG PO TB24: 50.0000 mg | ORAL_TABLET | Freq: Every day | ORAL | 3 refills | Status: AC

## 2019-01-27 NOTE — Progress Notes (Signed)
Patient referred by Stacy Morning, DO for tachycardia  Subjective:   Stacy [name], female    DOB: [birth year], 58 y.o.   MRN: [id number]   Chief Complaint  Patient presents with  . Tachycardia    pt c/o DOE,   . Palpitations  . New Patient (Initial Visit)    HPI  58 y.o. Caucasian female with h/o Grave's disease, h/o postablative hypothyroidism, referred for evaluation of palpitations.  Patient is here with her sister today.  Patient has myriad of complaints.  For past 2 weeks, she has been having constant palpitations, jittery and shakiness, shortness of breath.  She also endorses inability to sleep, loose watery stools, difficulty concentrating.  She has gained weight over the last several months.  Patient recently underwent Zio patch placement through PCP, which showed brief 5-12 beat runs of SVT.  Average heart rate on the monitor in July was 81 bpm. Her blood pressure has been elevated recently. She has never had hypertension prior to this.   Patient was recently in multiple emergency hospital with left-sided shoulder/back pain.  She underwent CT angiogram that did not show dissection or pulmonary embolism.  High-sensitivity troponin was negative.  She was in sinus rhythm with rate 90 bpm.  Patient has history of Graves' disease with radioactive iodine ablation nearly 30 years ago.  Her levothyroxine dose has been adjusted several times in the last year or so.  Patient currently sees Dr. Chalmers Cater for her thyroid management, last seen two months ago.   Past Medical History:  Diagnosis Date  . Alcohol abuse   . Allergy   . Anxiety   . Closed fracture of left distal radius   . Depression   . HSV (herpes simplex virus) infection   . Hyperlipidemia   . Insomnia   . Unspecified hypothyroidism    Graves     Past Surgical History:  Procedure Laterality Date  . COLPOSCOPY    . COSMETIC SURGERY     breast implants saline  . OPEN REDUCTION INTERNAL FIXATION (ORIF) DISTAL  RADIAL FRACTURE Left 08/19/2017   Procedure: OPEN REDUCTION INTERNAL FIXATION (ORIF) LEFT DISTAL RADIAL FRACTURE;  Surgeon: Stacy Cover, MD;  Location: [facility];  Service: Orthopedics;  Laterality: Left;     Social History   Socioeconomic History  . Marital status: Divorced    Spouse name: Not on file  . Number of children: Not on file  . Years of education: Not on file  . Highest education level: Not on file  Occupational History  . Not on file  Social Needs  . Financial resource strain: Not on file  . Food insecurity    Worry: Not on file    Inability: Not on file  . Transportation needs    Medical: Not on file    Non-medical: Not on file  Tobacco Use  . Smoking status: Former Smoker    Packs/day: 1.00    Quit date: 06/08/2006    Years since quitting: 12.6  . Smokeless tobacco: Never Used  Substance and Sexual Activity  . Alcohol use: No    Alcohol/week: 90.0 standard drinks    Types: 90 Glasses of wine per week    Frequency: Never    Comment: Per previous documentation: 90 glasses of wine per week; pt now states only occasional alcohol use 11/02/15  . Drug use: No  . Sexual activity: Not on file  Lifestyle  . Physical activity    Days per week: Not  on file    Minutes per session: Not on file  . Stress: Not on file  Relationships  . Social Herbalist on phone: Not on file    Gets together: Not on file    Attends religious service: Not on file    Active member of club or organization: Not on file    Attends meetings of clubs or organizations: Not on file    Relationship status: Not on file  . Intimate partner violence    Fear of current or ex partner: Not on file    Emotionally abused: Not on file    Physically abused: Not on file    Forced sexual activity: Not on file  Other Topics Concern  . Not on file  Social History Narrative   Pt lives in [place] alone. Pt has completed HS. 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 Divorced, married x2 for 10 yrs. No kids.      Family History  Problem Relation Age of Onset  . Stroke Mother   . Leukemia Father   . Alcohol abuse Father   . Depression Sister   . Alcohol abuse Sister   . Bipolar disorder Sister   . Depression Sister   . Depression Sister   . Alcohol abuse Sister   . Depression Sister   . Heart disease Other   . Hyperlipidemia Other   . Diabetes Other   . Alcohol abuse Maternal Uncle   . Alcohol abuse Paternal Uncle   . Alcohol abuse Paternal Grandfather      Current Outpatient Medications on File Prior to Visit  Medication Sig Dispense Refill  . HYDROcodone-acetaminophen (NORCO/VICODIN) 5-325 MG tablet Take 1-2 tablets by mouth every 6 (six) hours as needed for moderate pain or severe pain. 20 tablet 0  . ibuprofen (ADVIL,MOTRIN) 200 MG tablet Take 200 mg by mouth every 6 (six) hours as needed.    Marland Kitchen levothyroxine (SYNTHROID) 112 MCG tablet Take 112 mcg by mouth daily before breakfast.    . metoprolol succinate (TOPROL-XL) 25 MG 24 hr tablet Take 12.5 mg by mouth at bedtime.    . Suvorexant (BELSOMRA) 20 MG TABS Take 20 mg by mouth at bedtime.      No current facility-administered medications on file prior to visit.     Cardiovascular studies:  EKG 01/27/2019: Sinus tachycardia 135 bpm.  Frequent PACs -atrial bigeminy.  EKG was repeated to confirm the diagnosis.  EKG 01/27/2019: Sinus tachycardia 135 bpm.  Occasional PACs.  Nonspecific T-abnormality.    Zio patch 12/14/2018- 12/23/2018: Dominant rhythm sinus.  Brief episodes of SVT, 5-12 beat runs, fastest at 240 bpm. No atrial flutter/ atrial fibrillation/ VT/sinus pause/ high grade AV block.  CT Chest 01/18/2019: 1. No evidence of acute aortic syndrome. No acute intrathoracic or intra-abdominal process. 2. Pectus excavatum deformity of the chest with associated mild mass effect on the right atrium and ventricle. 3. Hepatic steatosis. 4. Colonic diverticulosis  without evidence of diverticulitis 5.  Aortic Atherosclerosis (ICD10-I70.0).   Recent labs: Results for PARYS, ELENBAAS (MRN [id number]) as of 01/27/2019 15:24  Ref. Range 01/18/2019 12:35  Sodium Latest Ref Range: 135 - 145 mmol/L 135  Potassium Latest Ref Range: 3.5 - 5.1 mmol/L 4.2  Chloride Latest Ref Range: 98 - 111 mmol/L 100  CO2 Latest Ref Range: 22 - 32 mmol/L 19 (L)  Glucose Latest Ref Range: 70 - 99 mg/dL 111 (H)  BUN Latest Ref  Range: 6 - 20 mg/dL 8  Creatinine Latest Ref Range: 0.44 - 1.00 mg/dL 0.83  Calcium Latest Ref Range: 8.9 - 10.3 mg/dL 9.8  Anion gap Latest Ref Range: 5 - 15  16 (H)  GFR, Est Non African American Latest Ref Range: >60 mL/min >60  GFR, Est African American Latest Ref Range: >60 mL/min >60  Troponin I (High Sensitivity) Latest Ref Range: <18 ng/L 5   Results for NYALA, KIRCHNER (MRN [id number]) as of 01/27/2019 15:24  Ref. Range 01/18/2019 12:35  WBC Latest Ref Range: 4.0 - 10.5 K/uL 8.3  RBC Latest Ref Range: 3.87 - 5.11 MIL/uL 3.53 (L)  Hemoglobin Latest Ref Range: 12.0 - 15.0 g/dL 13.1  HCT Latest Ref Range: 36.0 - 46.0 % 37.9  MCV Latest Ref Range: 80.0 - 100.0 fL 107.4 (H)  MCH Latest Ref Range: 26.0 - 34.0 pg 37.1 (H)  MCHC Latest Ref Range: 30.0 - 36.0 g/dL 34.6  RDW Latest Ref Range: 11.5 - 15.5 % 14.0  Platelets Latest Ref Range: 150 - 400 K/uL 243  nRBC Latest Ref Range: 0.0 - 0.2 % 0.0   Results for RUFUS, CYPERT (MRN [id number]) as of 01/27/2019 15:24  Ref. Range 01/18/2019 16:00 01/18/2019 17:13  TSH Latest Ref Range: 0.350 - 4.500 uIU/mL 0.339 (L)   Triiodothyronine,Free,Serum Latest Ref Range: 2.0 - 4.4 pg/mL  4.2    Review of Systems  Constitution: Positive for diaphoresis, malaise/fatigue and weight gain. Negative for decreased appetite and weight loss.  HENT: Negative for congestion.   Eyes: Negative for visual disturbance.  Cardiovascular: Positive for chest pain, dyspnea on exertion and palpitations. Negative for  leg swelling, near-syncope and syncope.  Respiratory: Negative for cough.   Endocrine: Negative for cold intolerance.  Hematologic/Lymphatic: Does not bruise/bleed easily.  Skin: Negative for itching and rash.  Musculoskeletal: Negative for myalgias.  Gastrointestinal: Positive for diarrhea and nausea. Negative for abdominal pain and vomiting.  Genitourinary: Negative for dysuria.  Neurological: Negative for dizziness and weakness.  Psychiatric/Behavioral: The patient is nervous/anxious.   All other systems reviewed and are negative.        Vitals:   01/27/19 1259  BP: (!) 137/99  Pulse: (!) 149  Temp: 97.8 F (36.6 C)  SpO2: 97%     Body mass index is 23.41 kg/m. Filed Weights   01/27/19 1259  Weight: 128 lb (58.1 kg)     Objective:   Physical Exam  Constitutional: She is oriented to person, place, and time. She appears well-developed and well-nourished. She appears distressed.  HENT:  Head: Normocephalic and atraumatic.  Eyes: Pupils are equal, round, and reactive to light. Conjunctivae are normal.  Neck: Normal range of motion. Neck supple. No JVD present.  Cardiovascular: Regular rhythm. Tachycardia present.  No murmur heard. Pulmonary/Chest: Effort normal and breath sounds normal. She has no wheezes. She has no rales.  Abdominal: Soft. Bowel sounds are normal. There is no rebound.  Musculoskeletal:        General: No edema.  Lymphadenopathy:    She has no cervical adenopathy.  Neurological: She is alert and oriented to person, place, and time. No cranial nerve deficit.  Skin: Skin is warm and dry.  Psychiatric:  Very anxious  Nursing note and vitals reviewed.         Assessment & Recommendations:   58 y.o. Caucasian female with h/o Grave's disease, h/o postablative hypothyroidism, referred for evaluation of palpitations.  Tachycardia:  EKG today shows sinus tachycardia with PACs.  Event monitor shows dominant rhythm of sinus, with occasional short  lasting episodes of SVT.  There is no evidence of atrial flutter, atrial fibrillation, ventricular tachycardia.   In conjunction with her constitutional symptoms of tremors, diarrhea, insomnia, new hypertension her clinical picture is concerning for hyperthyroid state.  I recommend close follow-up with her endocrinologist Dr. Chalmers Cater.  In the meantime, I have started her on metoprolol succinate 50 mg once daily.  I will obtain echocardiogram once heart rate is better controlled.  I will see her back in 4 weeks for follow-up.  Prolonged discussion regarding clinical diagnosis, management options, and investigation.  Multiple questions from patient and her sister answered at length.  Total time spent with patient was 40 minutes and greater than 50% of that time was spent in counseling and coordination care with the patient regarding complex decision making and discussion as state above.   Thank you for referring the patient to [place]. Please feel free to contact with any questions.  Nigel Mormon, MD [facility] Pager: [phone] Office: [phone] If no answer Cell [phone]

## 2019-02-15 ENCOUNTER — Other Ambulatory Visit: Payer: BC Managed Care – PPO

## 2019-03-03 ENCOUNTER — Ambulatory Visit: Payer: BC Managed Care – PPO | Admitting: Cardiology

## 2019-05-09 DEATH — deceased

## 2020-07-02 IMAGING — CT CT ANGIO CHEST-ABD-PELV FOR DISSECTION W/ AND WO/W CM
2 of 7 series · 13 of 46 positions shown, 15 images · IV contrast (omnipaque)
Comparison: Chest radiograph January 18, 2019

CLINICAL DATA: Chest, back pain, acute, suspect aortic dissection

EXAM:
CT ANGIOGRAPHY CHEST, ABDOMEN AND PELVIS
TECHNIQUE: Multidetector CT imaging through the chest, abdomen and pelvis was
performed using the standard protocol during bolus administration of
intravenous contrast. Multiplanar reconstructed images and MIPs were
obtained and reviewed to evaluate the vascular anatomy.
CONTRAST:  100mL OMNIPAQUE IOHEXOL 350 MG/ML SOLN

[Series 14: dissection 2mm · axial · 0.89mm/px · z∈[+824,+1382]mm · 10 of 315 slices shown, 12 images]
[im 18/315  soft-tissue]
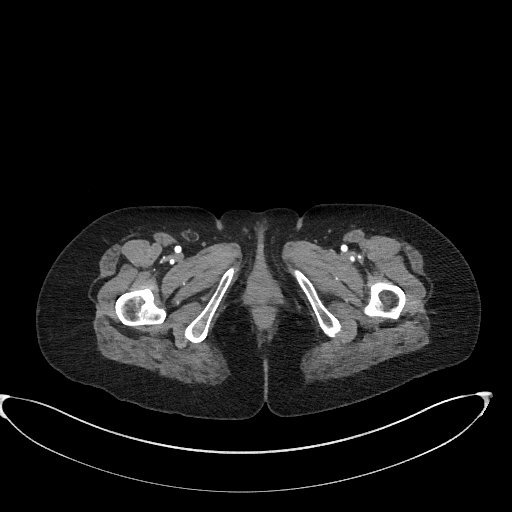
[im 18/315  bone]
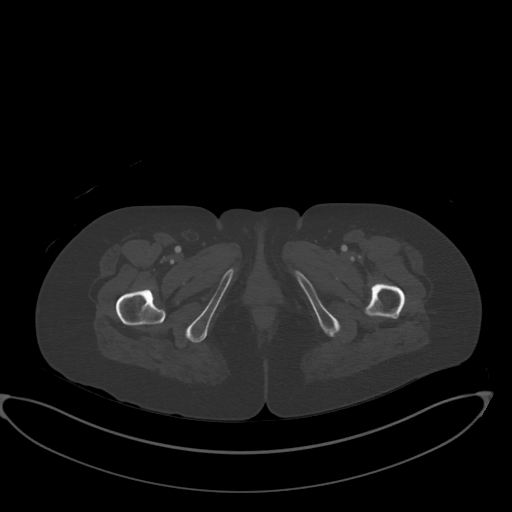
[im 53/315  soft-tissue]
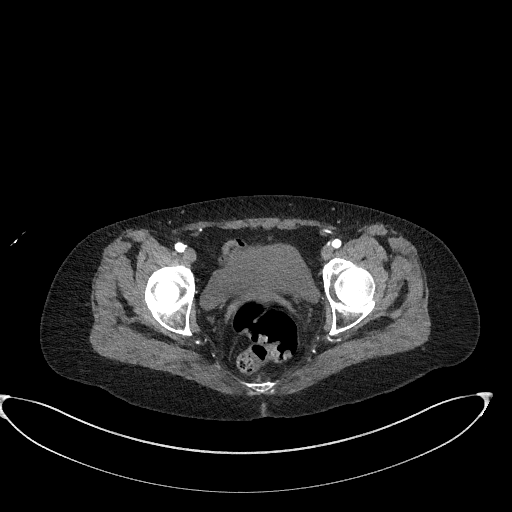
[im 88/315  soft-tissue]
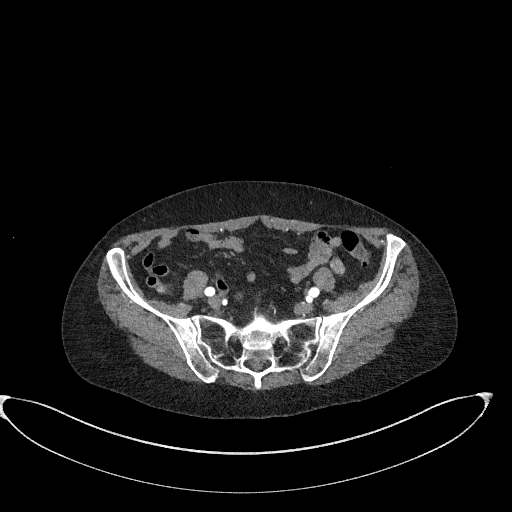
[im 105/315  soft-tissue]
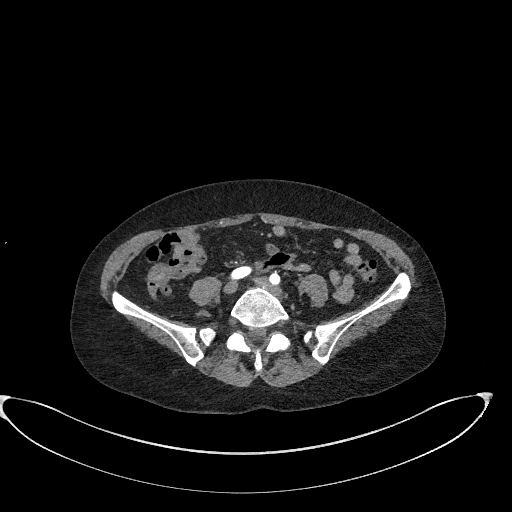
[im 140/315  soft-tissue]
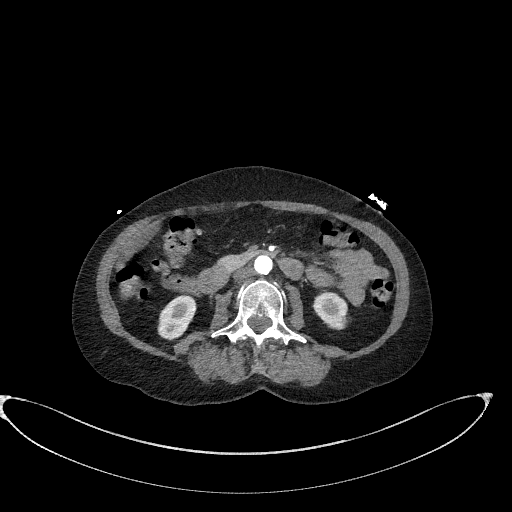
[im 175/315  soft-tissue]
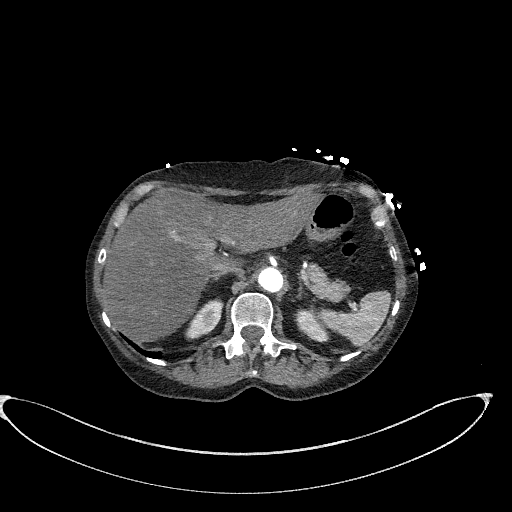
[im 210/315  soft-tissue]
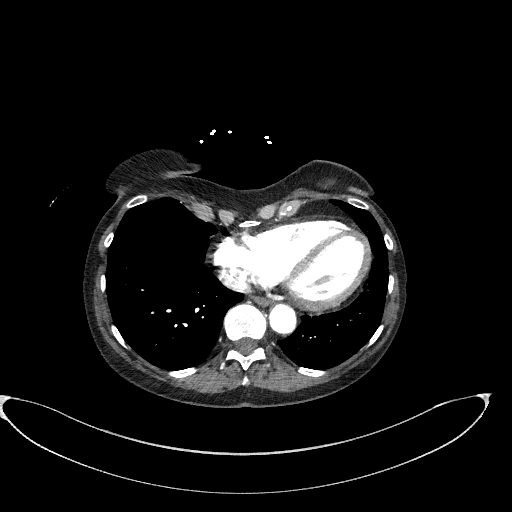
[im 227/315  soft-tissue]
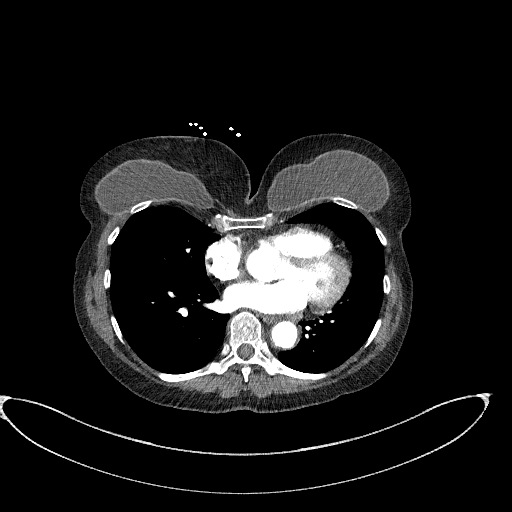
[im 262/315  soft-tissue]
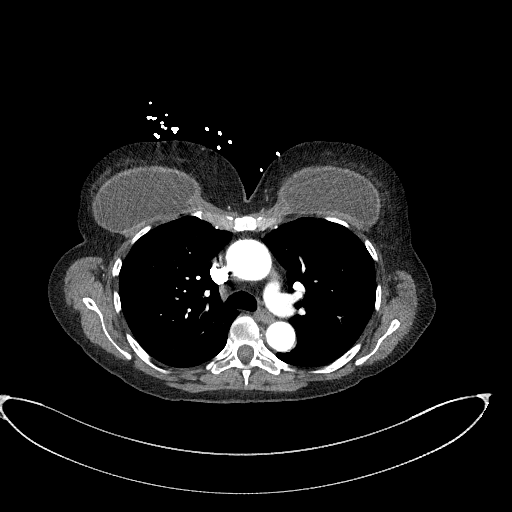
[im 262/315  bone]
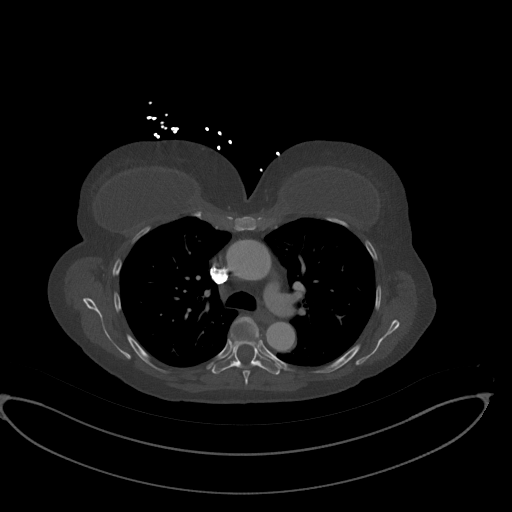
[im 297/315  soft-tissue]
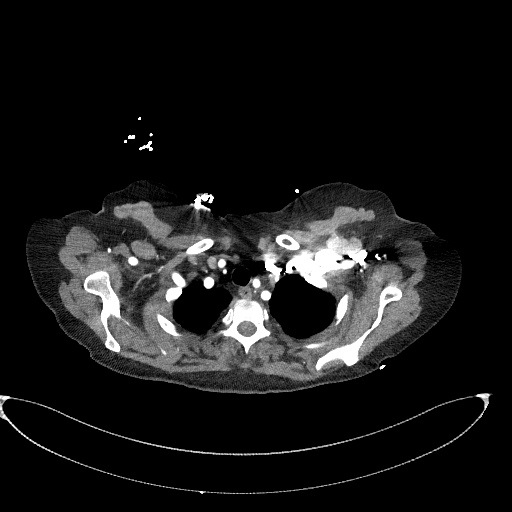

[Series 17: dissection 2mm cor · coronal · 0.79mm/px · 3 of 139 slices shown]
[im 35/139  soft-tissue]
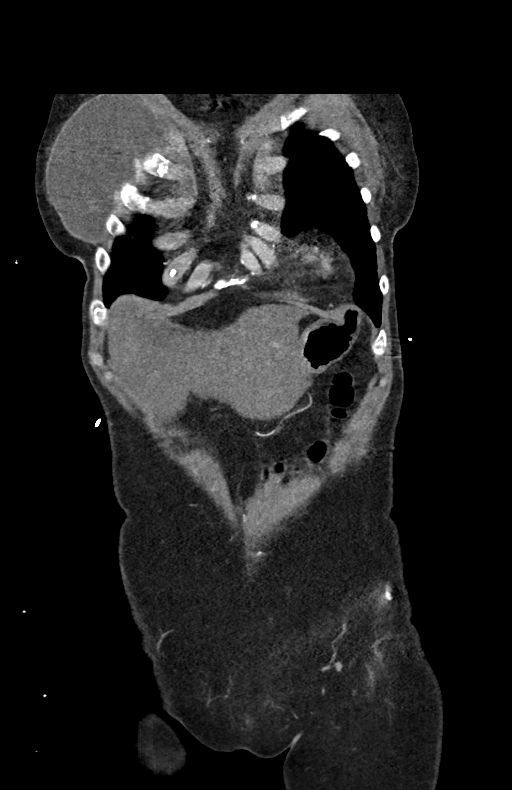
[im 70/139  soft-tissue]
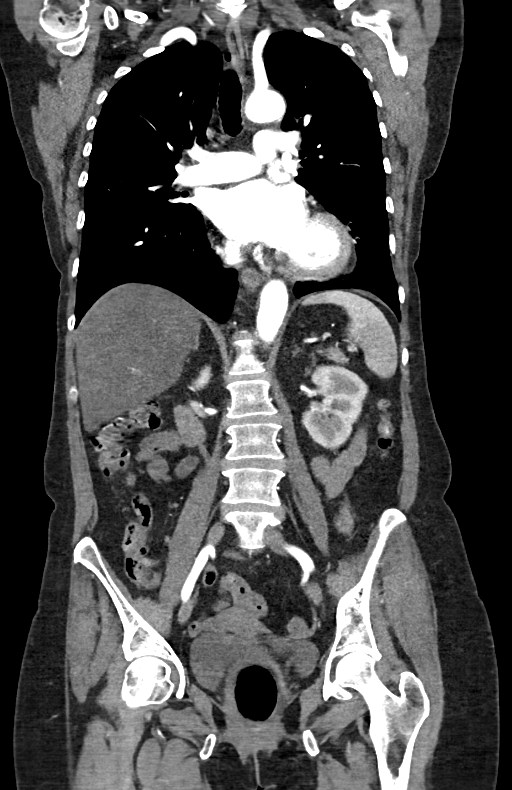
[im 104/139  soft-tissue]
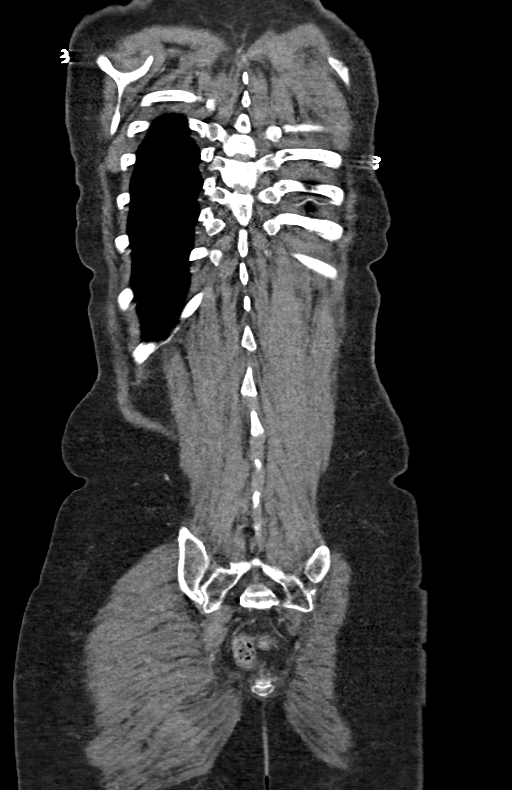

[13 of 46 positions shown; findings below may reference images not displayed]

FINDINGS: CTA CHEST FINDINGS

Cardiovascular: Contrast bolus is satisfactory for preferential
opacification of thoracic aorta. The aortic root is suboptimally
assessed given cardiac pulsation artifact. The aorta is normal
caliber. No intramural hematoma, dissection flap or other luminal
abnormality of the aorta is seen. No periaortic stranding or
hemorrhage. Incidental note made of the left vertebral artery
arising directly from the aortic arch.

Mild compressive affect the pectus deformity of the chest upon the
right atrium and ventricle. Normal heart size. Trace pericardial
fluid likely within physiologic normal. No frank pericardial
effusion.

Limited evaluation of the central pulmonary arteries reveals no a
filling defect.

Mediastinum/Nodes: No enlarged mediastinal or axillary lymph nodes.
Thyroid gland, trachea, and esophagus demonstrate no significant
findings.

Lungs/Pleura: No consolidation, features of edema, pneumothorax, or
effusion. No suspicious pulmonary nodules or masses.

Musculoskeletal: Pectus excavatum deformity of the chest with
increased Minecrafter index of 3.7 with associated mild mass effect on
the right atrium and ventricle. Bilateral subpectoral breast
prostheses. No CT evidence of capsular rupture.

Review of the MIP images confirms the above findings.

CTA ABDOMEN AND PELVIS FINDINGS

VASCULAR

Aorta: Minimal calcified atheromatous plaque within the abdominal
aorta. Normal caliber without aneurysm, dissection, vasculitis or
stenosis. No periaortic stranding.

Celiac: Patent without evidence of aneurysm, dissection, vasculitis
or significant stenosis.

SMA: Patent without evidence of aneurysm, dissection, vasculitis or
significant stenosis.

Renals: Both renal arteries are patent without evidence of aneurysm,
dissection, vasculitis, fibromuscular dysplasia or significant
stenosis.

IMA: Patent without evidence of aneurysm, dissection, vasculitis or
significant stenosis.

Inflow: Minimal atheromatous disease within the internal iliac
arteries bilaterally. Remainder of the inflow vessels are
unremarkable without aneurysm, dissection, vasculitis or stenosis

Veins: Major venous structures are unremarkable.

Review of the MIP images confirms the above findings.

NON-VASCULAR

Hepatobiliary: Diffuse hepatic hypoattenuation compatible with
hepatic steatosis. Focal sparing versus RUDJORD in the anterior right
liver. No concerning focal liver abnormality is seen. No gallstones,
gallbladder wall thickening, or biliary dilatation.

Pancreas: Unremarkable. No pancreatic ductal dilatation or
surrounding inflammatory changes.

Spleen: Normal in size without focal abnormality.

Adrenals/Urinary Tract: Adrenal glands are unremarkable. Kidneys are
normal, without renal calculi, focal lesion, or hydronephrosis.
Bladder is unremarkable.

Stomach/Bowel: Distal esophagus, stomach and duodenal sweep are
unremarkable. No bowel wall thickening or dilatation. No evidence of
obstruction. Scattered colonic diverticula without focal pericolonic
inflammation to suggest diverticulitis. A normal appendix is
visualized.

Lymphatic: No suspicious or enlarged lymph nodes in the included
lymphatic chains.

Reproductive: Normal anteverted uterus. No adnexal lesions.

Other: No abdominal wall hernia or abnormality. No abdominopelvic
ascites.

Musculoskeletal: No acute or significant osseous findings.

Review of the MIP images confirms the above findings.
IMPRESSION: 1. No evidence of acute aortic syndrome. No acute intrathoracic or
intra-abdominal process.
2. Pectus excavatum deformity of the chest with associated mild mass
effect on the right atrium and ventricle.
3. Hepatic steatosis.
4. Colonic diverticulosis without evidence of diverticulitis
5.  Aortic Atherosclerosis ([id number]).
